# Patient Record
Sex: Female | Born: 1955 | Hispanic: Yes | Marital: Married | Smoking: Never smoker
Health system: Southern US, Community
[De-identification: ages and names within clinical notes are randomized; demographics above are authoritative.]

## PROBLEM LIST (undated history)

## (undated) DIAGNOSIS — F32A Depression, unspecified: Secondary | ICD-10-CM

## (undated) DIAGNOSIS — E785 Hyperlipidemia, unspecified: Secondary | ICD-10-CM

## (undated) DIAGNOSIS — F329 Major depressive disorder, single episode, unspecified: Secondary | ICD-10-CM

## (undated) HISTORY — PX: AUGMENTATION MAMMAPLASTY: SUR837

## (undated) HISTORY — PX: TUBAL LIGATION: SHX77

## (undated) HISTORY — PX: BREAST SURGERY: SHX581

## (undated) HISTORY — DX: Hyperlipidemia, unspecified: E78.5

---

## 2010-07-31 HISTORY — PX: ABDOMINAL HYSTERECTOMY: SHX81

## 2014-10-06 ENCOUNTER — Encounter: Payer: Self-pay | Admitting: Gynecology

## 2014-10-06 ENCOUNTER — Other Ambulatory Visit (HOSPITAL_COMMUNITY)
Admission: RE | Admit: 2014-10-06 | Discharge: 2014-10-06 | Disposition: A | Discharge status: Home / Self Care | Payer: BLUE CROSS/BLUE SHIELD | Source: Ambulatory Visit | Attending: Gynecology | Admitting: Gynecology

## 2014-10-06 ENCOUNTER — Ambulatory Visit (INDEPENDENT_AMBULATORY_CARE_PROVIDER_SITE_OTHER): Payer: BLUE CROSS/BLUE SHIELD | Admitting: Gynecology

## 2014-10-06 VITALS — BP 122/78 | Ht 63.0 in | Wt 145.0 lb

## 2014-10-06 DIAGNOSIS — M25559 Pain in unspecified hip: Secondary | ICD-10-CM | POA: Diagnosis not present

## 2014-10-06 DIAGNOSIS — N951 Menopausal and female climacteric states: Secondary | ICD-10-CM | POA: Diagnosis not present

## 2014-10-06 DIAGNOSIS — Z79890 Hormone replacement therapy (postmenopausal): Secondary | ICD-10-CM

## 2014-10-06 DIAGNOSIS — Z1159 Encounter for screening for other viral diseases: Secondary | ICD-10-CM

## 2014-10-06 DIAGNOSIS — Z1151 Encounter for screening for human papillomavirus (HPV): Secondary | ICD-10-CM | POA: Diagnosis present

## 2014-10-06 DIAGNOSIS — Z780 Asymptomatic menopausal state: Secondary | ICD-10-CM | POA: Diagnosis not present

## 2014-10-06 DIAGNOSIS — Z01419 Encounter for gynecological examination (general) (routine) without abnormal findings: Secondary | ICD-10-CM

## 2014-10-06 MED ORDER — ESTROGENS CONJUGATED 0.625 MG PO TABS: 0.6250 mg | ORAL_TABLET | Freq: Every day | ORAL | Status: DC

## 2014-10-06 NOTE — Patient Instructions (Signed)
Artritis reumatoidea (Rheumatoid Arthritis) La artritis reumatoidea es una enfermedad a largo plazo (crnica) inflamatoria que causa dolor, hinchazn y entumecimiento de las articulaciones. Puede afectar a todo el organismo, incluidos los ojos y los pulmones. Los efectos varan en gran medida entre los que sufren esta enfermedad. CAUSAS  La causa de esta enfermedad no se conoce. Suele ser hereditaria y es ms frecuente entre mujeres. Ciertas clulas del sistema de defensa natural del organismo (sistema inmunolgico) no funcionan adecuadamente y comienzan a Clinical biochemist. En principio involucra al tejido conjuntivo que alinea las articulaciones (membrana sinovial). Esto puede causar una lesin en la articulacin. SNTOMAS   Dolor, entumecimiento, hinchazn y disminucin de la movilidad en varias articulaciones, especialmente en las manos y los pies.  El entumecimiento empeora por la maana. Puede durar entre 1 y 2 horas, o ms.  Adormecimiento u hormigueo en las manos.  Fatiga.  Prdida del apetito.  Prdida de peso.  Fiebre no muy elevada.  Ojos y boca secos.  Bultos firmes (ndulos reumatoideos) que crecen por debajo de la piel en zonas como codos y manos. DIAGNSTICO  El diagnstico se realiza segn los sntomas descriptos, un examen y [place] de [place]. Algunas veces es necesario tomar radiografas. TRATAMIENTO  Los [employer] del tratamiento son Best boy, reducir la inflamacin y disminuir o Ambulance person lesin en las articulaciones y la discapacidad. Los mtodos varan y pueden ser:  Theatre manager un equilibrio entre reposo, [place] fsica y nutricin [place].  Medicamentos:  Analgsicos.  Corticoides y antiinflamatorios no esteroides para reducir la inflamacin.  Antirreumticos modificadores de la enfermedad (ARME) para disminuir el avance de la artritis reumatoidea.  Modificadores de respuesta biolgica para reducir la inflamacin y las  lesiones.  Fisioterapia y terapia ocupacional.  Ciruga en aquellos pacientes con lesiones graves en las articulaciones. Puede ser necesario el reemplazo o fusin de las articulaciones.  Los controles de [place] y el cuidado continuo, como visitas al [place], anlisis de [place] y [place], y Manufacturing engineer. INSTRUCCIONES PARA EL CUIDADO EN EL HOGAR   Mantngase fsicamente activo y Psychologist, prison and probation services la actividad cuando la enfermedad [place].  Consuma una dieta bien balanceada.  Aplquese calor en las articulaciones afectadas al despertarse y antes de [employer]. Mantenga el calor sobre la articulacin afectada durante el tiempo que le indique su mdico.  Aplquese hielo en las articulaciones afectadas luego de realizar actividades o de Geneticist, molecular.  Ponga el hielo en una bolsa plstica.  Coloque una toalla entre la piel y la bolsa de hielo.  Deje el hielo durante 15 a 68minutos, 3 a 4veces por da, o [place] los medicamentos y los suplementos solamente como se lo haya indicado el mdico.  Use las frulas segn las indicaciones de su mdico. Las frulas mantienen la funcin y la posicin de las articulaciones.  No duerma con almohadas debajo de las rodillas. Esto puede causarle espasmos.  Participe en un programa de [place] para mantenerse actualizado con los ltimos tratamientos y modos de Engineer, civil (consulting) enfermedad. SOLICITE ATENCIN MDICA DE INMEDIATO SI:  Sufre un desmayo.  Por momentos, siente debilidad extrema.  Desarrolla rpidamente calor y dolor en una articulacin que son ms intensos que los dolores articulares habituales.  Tiene escalofros.  Tiene fiebre. PARA OBTENER MS [place] Lexicographer of Rheumatology): www.rheumatology.org.  Fundacin contra la Artritis ([place]): www.arthritis.org Document Released: 04/26/2005 Document Revised:  12/01/2013 [facility] Patient Information 2015 [place], [place]. This information is  not intended to replace advice given to you by your health care provider. Make sure you discuss any questions you have with your health care provider.

## 2014-10-06 NOTE — Progress Notes (Signed)
Robin Boyd 08/15/1955 [id number]   History:    59 y.o.  for annual gyn exam who is a new patient to the practice. Patient recently moved here from [place]. She stated her gynecological care was there. She states she has never had any abnormal Pap smear in the past. She was complaining of hot flashes, irritability mood swing and vaginal dryness and anxiety as well as insomnia. She had been on estrogen 0.625 mg which she ran out a few months ago which has contributed to her symptoms. She stated that at the age of 59 she had an abdominal hysterectomy with ovarian conservation along with bladder suspension whereby a mesh was placed for fibroid uterus since stress urinary incontinence. Patient was also complaining of hip pains when she is inactive but improves with mobility. She denies any family history of any rheumatoid arthritis. She has not had her colonoscopy as of yet. Her mammogram was in 2015. She stated in 2015 she had a normal bone density study in been as well.  Past medical history,surgical history, family history and social history were all reviewed and documented in the EPIC chart.  Gynecologic History Patient's last menstrual period was 10/06/2010. Contraception: post menopausal status/total abdominal hysterectomy Last Pap: 2015. Results were: [place] we do not have report Last mammogram: 2015. Results were: Patient reports it was normal as well  Obstetric History OB History  Gravida Para Term Preterm AB SAB TAB Ectopic Multiple Living  2 2        2     # Outcome Date GA Lbr Len/2nd Weight Sex Delivery Anes PTL Lv  2 Para           1 Para                ROS: A ROS was performed and pertinent positives and negatives are included in the history.  GENERAL: No fevers or chills. HEENT: No change in vision, no earache, sore throat or sinus congestion. NECK: No pain or stiffness. CARDIOVASCULAR: No chest pain or pressure. No palpitations. PULMONARY: No shortness of breath,  cough or wheeze. GASTROINTESTINAL: No abdominal pain, nausea, vomiting or diarrhea, melena or bright red blood per rectum. GENITOURINARY: No urinary frequency, urgency, hesitancy or dysuria. MUSCULOSKELETAL: No joint or muscle pain, no back pain, no recent trauma. DERMATOLOGIC: No rash, no itching, no lesions. ENDOCRINE: No polyuria, polydipsia, no heat or cold intolerance. No recent change in weight. HEMATOLOGICAL: No anemia or easy bruising or bleeding. NEUROLOGIC: No headache, seizures, numbness, tingling or weakness. PSYCHIATRIC: No depression, no loss of interest in normal activity or change in sleep pattern.     Exam: chaperone present  BP 122/78 mmHg  Ht 5\' 3"  (1.6 m)  Wt 145 lb (65.772 kg)  BMI 25.69 kg/m2  LMP 10/06/2010  Body mass index is 25.69 kg/(m^2).  General appearance : Well developed well nourished female. No acute distress HEENT: Eyes: no retinal hemorrhage or exudates,  Neck supple, trachea midline, no carotid bruits, no thyroidmegaly Lungs: Clear to auscultation, no rhonchi or wheezes, or rib retractions  Heart: Regular rate and rhythm, no murmurs or gallops Breast:Examined in sitting and supine position were symmetrical in appearance, no palpable masses or tenderness,  no skin retraction, no nipple inversion, no nipple discharge, no skin discoloration, no axillary or supraclavicular lymphadenopathy Abdomen: no palpable masses or tenderness, no rebound or guarding Extremities: no edema or skin discoloration or tenderness  Pelvic:  Bartholin, Urethra, Skene Glands: Within normal limits  Vagina: No gross lesions or discharge  Cervix: Absent  Uterus  absent  Adnexa  Without masses or tenderness  Anus and perineum  normal   Rectovaginal  normal sphincter tone without palpated masses or tenderness             Hemoccult will schedule colonoscopy     Assessment/Plan:  59 y.o. female for annual exam with signs and symptoms consistent with the menopause.  Patient had ran out of her hormones that had been prescribed in [place]. She will be prescribed Premarin 0.625 mg to take 1 by mouth daily. We discussed the women's health initiative study. We discussed the risk benefits and pros and cons of hormone replacement therapy. She will be reminded to schedule her mammogram. We discussed importance of monthly self breast examination. She was given the name of I GI colleagues for her to schedule a screening colonoscopy. A cousin of her hip pains were going to check a rheumatoid factor blood test. The following screening tests were also ordered: Comprehensive metabolic panel, TSH, fasting lipid profile, CBC, urinalysis. A Pap smear was done today for our records and then we will adhere to the new guidelines.  New CDC guidelines is recommending patients be tested once in her lifetime for hepatitis C antibody who were born between [birth year] through 1963. This was discussed with the patient today and has agreed to be tested today.   Terrance Mass MD, 11:57 AM 10/06/2014

## 2014-10-07 LAB — URINALYSIS W MICROSCOPIC + REFLEX CULTURE
BACTERIA UA: NONE SEEN
BILIRUBIN URINE: NEGATIVE
CASTS: NONE SEEN
CRYSTALS: NONE SEEN
Glucose, UA: NEGATIVE mg/dL
Ketones, ur: NEGATIVE mg/dL
Leukocytes, UA: NEGATIVE
NITRITE: NEGATIVE
PH: 6 (ref 5.0–8.0)
Protein, ur: NEGATIVE mg/dL
Specific Gravity, Urine: 1.016 (ref 1.005–1.030)
Urobilinogen, UA: 0.2 mg/dL (ref 0.0–1.0)

## 2014-10-08 ENCOUNTER — Other Ambulatory Visit: Payer: BLUE CROSS/BLUE SHIELD

## 2014-10-08 LAB — CYTOLOGY - PAP

## 2014-10-08 LAB — CBC WITH DIFFERENTIAL/PLATELET
BASOS ABS: 0 10*3/uL (ref 0.0–0.1)
Basophils Relative: 1 % (ref 0–1)
EOS ABS: 0.1 10*3/uL (ref 0.0–0.7)
Eosinophils Relative: 2 % (ref 0–5)
HEMATOCRIT: 39.1 % (ref 36.0–46.0)
Hemoglobin: 12.5 g/dL (ref 12.0–15.0)
LYMPHS ABS: 1.8 10*3/uL (ref 0.7–4.0)
LYMPHS PCT: 36 % (ref 12–46)
MCH: 28.2 pg (ref 26.0–34.0)
MCHC: 32 g/dL (ref 30.0–36.0)
MCV: 88.1 fL (ref 78.0–100.0)
MPV: 9.8 fL (ref 8.6–12.4)
Monocytes Absolute: 0.4 10*3/uL (ref 0.1–1.0)
Monocytes Relative: 9 % (ref 3–12)
NEUTROS ABS: 2.5 10*3/uL (ref 1.7–7.7)
NEUTROS PCT: 52 % (ref 43–77)
PLATELETS: 242 10*3/uL (ref 150–400)
RBC: 4.44 MIL/uL (ref 3.87–5.11)
RDW: 14.7 % (ref 11.5–15.5)
WBC: 4.9 10*3/uL (ref 4.0–10.5)

## 2014-10-08 LAB — LIPID PANEL
Cholesterol: 283 mg/dL — ABNORMAL HIGH (ref 0–200)
HDL: 56 mg/dL (ref >=46)
LDL CALC: 206 mg/dL — AB (ref 0–99)
TRIGLYCERIDES: 107 mg/dL (ref <150)
Total CHOL/HDL Ratio: 5.1 Ratio
VLDL: 21 mg/dL (ref 0–40)

## 2014-10-08 LAB — COMPREHENSIVE METABOLIC PANEL
ALBUMIN: 4.2 g/dL (ref 3.5–5.2)
ALT: 53 U/L — ABNORMAL HIGH (ref 0–35)
AST: 39 U/L — AB (ref 0–37)
Alkaline Phosphatase: 36 U/L — ABNORMAL LOW (ref 39–117)
BUN: 16 mg/dL (ref 6–23)
CO2: 27 meq/L (ref 19–32)
Calcium: 9 mg/dL (ref 8.4–10.5)
Chloride: 106 mEq/L (ref 96–112)
Creat: 0.86 mg/dL (ref 0.50–1.10)
GLUCOSE: 78 mg/dL (ref 70–99)
Potassium: 4.3 mEq/L (ref 3.5–5.3)
Sodium: 143 mEq/L (ref 135–145)
TOTAL PROTEIN: 6.6 g/dL (ref 6.0–8.3)
Total Bilirubin: 0.5 mg/dL (ref 0.2–1.2)

## 2014-10-08 LAB — RHEUMATOID FACTOR: Rhuematoid fact SerPl-aCnc: <10 IU/mL (ref <=14)

## 2014-10-08 LAB — TSH: TSH: 1.499 u[IU]/mL (ref 0.350–4.500)

## 2014-10-09 LAB — HEPATITIS C ANTIBODY: HCV Ab: NEGATIVE

## 2015-10-07 ENCOUNTER — Encounter: Payer: BLUE CROSS/BLUE SHIELD | Admitting: Gynecology

## 2015-10-21 ENCOUNTER — Other Ambulatory Visit: Payer: Self-pay | Admitting: Gynecology

## 2015-10-21 NOTE — Telephone Encounter (Signed)
Annual scheduled on 11/23/15

## 2015-11-23 ENCOUNTER — Encounter: Payer: BLUE CROSS/BLUE SHIELD | Admitting: Gynecology

## 2015-11-23 ENCOUNTER — Ambulatory Visit (INDEPENDENT_AMBULATORY_CARE_PROVIDER_SITE_OTHER): Payer: BLUE CROSS/BLUE SHIELD | Admitting: Gynecology

## 2015-11-23 ENCOUNTER — Encounter: Payer: Self-pay | Admitting: Gynecology

## 2015-11-23 VITALS — BP 118/74 | Ht 62.5 in | Wt 146.0 lb

## 2015-11-23 DIAGNOSIS — Z01419 Encounter for gynecological examination (general) (routine) without abnormal findings: Secondary | ICD-10-CM | POA: Diagnosis not present

## 2015-11-23 DIAGNOSIS — Z79890 Hormone replacement therapy (postmenopausal): Secondary | ICD-10-CM | POA: Diagnosis not present

## 2015-11-23 DIAGNOSIS — Z780 Asymptomatic menopausal state: Secondary | ICD-10-CM | POA: Diagnosis not present

## 2015-11-23 MED ORDER — ESTRADIOL 0.1 MG/24HR TD PTTW: 1.0000 | MEDICATED_PATCH | TRANSDERMAL | Status: DC

## 2015-11-23 MED ORDER — ESTROGENS CONJUGATED 0.625 MG PO TABS: 0.6250 mg | ORAL_TABLET | Freq: Every day | ORAL | Status: DC

## 2015-11-23 NOTE — Patient Instructions (Addendum)
Estradiol skin patches Qu es este medicamento? Los parches de ESTRADIOL contienen un estrgeno. Se utiliza como terapia de reemplazo hormonal en mujeres que estn experimentando la menopausia. Ayuda a tratar los sofocos y a prevenir la osteoporosis. Se utiliza tambin para tratar a mujeres con bajos niveles de estrgeno o aquellas que le han extirpado sus ovarios. Este medicamento puede ser utilizado para otros usos; si tiene alguna pregunta consulte con su proveedor de atencin mdica o con su farmacutico. Qu le debo informar a mi profesional de la salud antes de tomar este medicamento? Necesita saber si usted presenta alguno de los siguientes problemas o situaciones: -sangrado vaginal anormal -enfermedad vascular o cogulos sanguneos -cncer de mama, cervical, endometrio, ovario, hgado o uterino -demencia -diabetes -enfermedad de la vescula biliar -enfermedad cardiaca o ataque cardiaco reciente -alta presin sangunea -niveles elevados de colesterol -nivel elevado de calcio en la sangre -histerectoma -enfermedad renal -enfermedad heptica -migraas -deficiencia de protena C -deficiencia de protena S -derrame cerebral -lupus eritematoso sistmico (LES) -fuma tabaco -reaccin alrgica o inusual a los estrgenos, otras hormonas, otros medicamentos, alimentos, colorantes o conservantes -si est embarazada o buscando quedar embarazada -si est amamantando a un beb Cmo debo [employer]? Este medicamento es slo para uso externo. Siga las instrucciones de la etiqueta del [place]. Abra la bolsa sin usar tijeras. Quite la cubierta rgida que protege al [place]. Trate de no tocar [employer]. Aplique el parche con el lado con pegamento hacia la piel, en una zona del cuerpo que est limpia, seca y sin vello. Evite las zonas con lesiones, irritaciones, callosidades o cicatrices. No aplique los parches en las mamas ni alrededor de Science writer. Utilice un lugar  distinto en cada ocasin para evitar la irritacin de la piel. No corte ni recorte el parche. No interrumpa su uso excepto si as lo indica su mdico o su profesional de [employer]	[place]. No use ms de un parche por vez a menos que su mdico o su profesional de la salud indique lo contrario. Hable con su pediatra para informarse acerca del uso de este medicamento en nios. Puede requerir atencin especial. Usted recibir un prospecto para el paciente para este producto con cada receta y relleno. Asegrese de leer este folleto cada vez cuidadosamente. Este folleto puede cambiar con frecuencia. Sobredosis: Pngase en contacto inmediatamente con un centro toxicolgico o una sala de urgencia si usted cree que haya tomado demasiado medicamento. ATENCIN: [employer] es solo para usted. No comparta este medicamento con nadie. Qu sucede si me olvido de una dosis? Si olvida una dosis, aplquela lo antes posible. Si es casi la hora de la prxima dosis, aplique slo esa dosis. No use dosis adicionales o dobles. Qu puede interactuar con este medicamento? No tome esta medicina con ninguno de los siguientes medicamentos: -inhibidores de Hydrologist, tales como aminoglutetimida, anastrozol, exemestno, letrozol, testolactona Esta medicina tambin puede interactuar con los siguientes medicamentos: -carbamazepina -ciertos antibiticos utilizados para tratar infecciones -ciertos barbitricos usados para inducir el sueo o tratar las convulsiones -jugo de toronja -medicamentos para infecciones micticas, tales como itraconazol y Arboriculturist -raloxifeno o tamoxifeno -rifabutina, rifampicina o rifapentina -ritonavir -hierba de [place] Puede ser que esta lista no menciona todas las posibles interacciones. Informe a su profesional de [employer]	[place] de [employer] productos a base de hierbas, medicamentos de [place] o suplementos nutritivos que est tomando. Si usted fuma, consume bebidas alcohlicas o si utiliza  drogas ilegales, indqueselo tambin a su profesional de [employer]	[place]. Algunas sustancias pueden  interactuar con su medicamento. A qu debo estar atento al usar [employer]? Visite a su mdico o a su profesional de la salud para chequear su evolucin peridicamente. Mientras permanezca en terapia con este medicamento, deber hacerse exmenes de Papanicolaou y exmenes de las mamas y la pelvis en forma regular. Tambin debe discutir con su profesional de la salud la necesidad de Sport and exercise psychologist peridicamente y seguir las pautas que este profesional establezca para estas pruebas. Este medicamento puede hacer que su cuerpo retenga lquido, lo que puede provocar que se le hinchen los dedos, manos o tobillos. Su presin sangunea Regulatory affairs officer. Comunquese con su mdico o con su profesional de la salud si siente que est reteniendo lquido. Si tiene algn motivo para pensar que est embarazada, deje de usar este medicamento de inmediato y comunquese con su mdico o con su profesional de Technical sales engineer. El hbito de fumar aumenta el riesgo de formacin de cogulos o de sufrir un derrame cerebral mientras este usando este medicamento, especialmente si tiene ms de 35 aos. Se le recomienda enfticamente que no fume. Si [place] lentes de contacto y observa cambios en la visin, o si los lentes comienzan a resultarle incmodos, consulte al especialista en ojos o su profesional de [employer]	[place]. Este medicamento incrementa el riesgo de desarrollar una enfermedad (hiperplasia del endometrio) que puede derivar en cncer del revestimiento del tero en mujeres que an tienen su tero. Al tomar otra hormona llamada progestina junto con este medicamento, se disminuye el riesgo de desarrollar esta enfermedad. Por lo tanto, si no le han extirpado el tero (por medio de una histerectoma), su mdico puede recetarle progestina para acompaar el tratamiento con estrgeno. Sin embargo, debe saber que puede correr [employer] adicionales  para la salud al tomar estrgenos con progestina. Debe consultar sobre el uso de estrgenos y progestinas a su profesional de la salud a fin de Aeronautical engineer beneficios y Gaffer que puede [place]. Si va a someterse a una operacin o un procedimiento MRI, tal vez deba dejar de tomar este medicamento. Consulte a su profesional de la salud antes de Editor, commissioning operacin. Puede baarse y Optometrist otras actividades mientras [place] el parche. Si el parche se afloja o se desprende, puede volver a aplicarlo siempre que quede suficientemente adherido a la piel. Al volver a aplicar el parche, debe hacerlo en una zona diferente. De lo contrario, reemplcelo por un parche nuevo. Qu efectos secundarios puedo tener al [employer] este medicamento? Efectos secundarios que debe informar a su mdico o a Barrister's clerk de la salud tan pronto como sea posible: -Chief of Staff como erupcin cutnea, picazn o urticarias, hinchazn de la cara, labios o lengua -secreciones o cambios en el tejido de las mamas -cambios en la visin -dolor en el pecho -confusin, dificultad para hablar o entender -orina de color amarillo oscuro -sensacin general de estar enfermo o sntomas gripales -heces claras -nuseas o vmito -dolor, hinchazn, sensacin clida en las piernas -dolor en la parte superior del abdomen -dolores de cabeza severos -falta de aliento repentina -debilidad o entumecimiento repentino de la cara, brazos o piernas -problemas para caminar, mareos, prdida de la coordinacin o equilibrio -sangrado vaginal inusual -color amarillento de los ojos o la piel Efectos secundarios que, por lo general, no requieren Geophysical data processor (debe informarlos a su mdico o a Barrister's clerk de la salud si persisten o si son molestos): -cada del cabello -aumento de apetito o sed -aumento de descargas de orina -sntomas de una infeccin vaginal, como picazn, irritacin  o flujo inusual -cansancio o debilidad inusual Puede  ser que esta lista no menciona todos los posibles efectos secundarios. Comunquese a su mdico por asesoramiento mdico [employer]. Usted puede informar los efectos secundarios a la FDA por telfono al 1-800-FDA-1088. Dnde debo guardar mi medicina? Mantngala fuera del alcance de los nios. Gurdela a temperatura ambiente a menos de 30 grados C (86 grados F). No guarde parches que hayan estado fuera de su bolsa protectora. Deseche todo el medicamento que no haya utilizado, despus de la fecha de vencimiento. Deseche los parches utilizados de [employer]. Ya que los parches usados todava pueden contener hormonas activas, doble el parche por la mitad con los lados con pegamento hacia adentro antes de desecharlo. ATENCIN: Este folleto es un resumen. Puede ser que no cubra toda la posible informacin. Si usted tiene preguntas acerca de esta medicina, consulte con su mdico, su farmacutico o su profesional de Technical sales engineer.    2016, Elsevier/Gold Standard. (2014-09-08 00:00:00) Bone Densitometry Bone densitometry is an imaging test that uses a special X-ray to measure the amount of calcium and other minerals in your bones (bone density). This test is also known as a bone mineral density test or dual-energy X-ray absorptiometry (DXA). The test can measure bone density at your hip and your spine. It is similar to having a regular X-ray. You may have this test to:  Diagnose a condition that causes weak or thin bones (osteoporosis).  Predict your risk of a broken bone (fracture).  Determine how well osteoporosis treatment is working. LET [facility] CARE PROVIDER KNOW ABOUT:  Any allergies you have.  All medicines you are taking, including vitamins, herbs, eye drops, creams, and over-the-counter medicines.  Previous problems you or members of your family have had with the use of anesthetics.  Any blood disorders you have.  Previous surgeries you have had.  Medical  conditions you have.  Possibility of pregnancy.  Any other medical test you had within the previous 14 days that used contrast material. RISKS AND COMPLICATIONS Generally, this is a safe procedure. However, problems can occur and may include the following:  This test exposes you to a very small amount of radiation.  The risks of radiation exposure may be greater to unborn children. BEFORE THE PROCEDURE  Do not take any calcium supplements for 24 hours before having the test. You can otherwise eat and drink what you usually do.  Take off all metal jewelry, eyeglasses, dental appliances, and any other metal objects. PROCEDURE  You may lie on an exam table. There will be an X-ray generator below you and an imaging device above you.  Other devices, such as boxes or braces, may be used to position your body properly for the scan.  You will need to lie still while the machine slowly scans your body.  The images will show up on a computer monitor. AFTER THE PROCEDURE You may need more testing at a later time.   This information is not intended to replace advice given to you by your health care provider. Make sure you discuss any questions you have with your health care provider.   Document Released: 08/08/2004 Document Revised: 08/07/2014 Document Reviewed: 12/25/2013 Elsevier Interactive Patient Education [employer].

## 2015-11-23 NOTE — Progress Notes (Signed)
Robin Boyd 08/25/1955 [id number]   History:    60 y.o.  for annual gyn exam with no complaints today. Patient was seen as a new patient for the first time last year. Patient had moved to [place] from [place]. Patient reports no past history of any abnormal Pap smears prior to her abdominal hysterectomy with ovarian conservation and bladder suspension with mesh as a result of systematic fibroid uterus and urinary incontinence. Patient had been on Premarin 00.625 milligrams daily and would like to switch over to the patch. She has not had a colonoscopy as of yet and her bone density study was done over 3 years ago in [place]. Patient has silicon breast implants. Past medical history,surgical history, family history and social history were all reviewed and documented in the EPIC chart.  Gynecologic History Patient's last menstrual period was 10/06/2010. Contraception: status post hysterectomy Last Pap: normal  . Results were: normal 2016 Last mammogram: several years ago and then as well. Results were: normal  Obstetric History OB History  Gravida Para Term Preterm AB SAB TAB Ectopic Multiple Living  2 2        2     # Outcome Date GA Lbr Len/2nd Weight Sex Delivery Anes PTL Lv  2 Para           1 Para                ROS: A ROS was performed and pertinent positives and negatives are included in the history.  GENERAL: No fevers or chills. HEENT: No change in vision, no earache, sore throat or sinus congestion. NECK: No pain or stiffness. CARDIOVASCULAR: No chest pain or pressure. No palpitations. PULMONARY: No shortness of breath, cough or wheeze. GASTROINTESTINAL: No abdominal pain, nausea, vomiting or diarrhea, melena or bright red blood per rectum. GENITOURINARY: No urinary frequency, urgency, hesitancy or dysuria. MUSCULOSKELETAL: No joint or muscle pain, no back pain, no recent trauma. DERMATOLOGIC: No rash, no itching, no lesions. ENDOCRINE: No polyuria, polydipsia, no heat or  cold intolerance. No recent change in weight. HEMATOLOGICAL: No anemia or easy bruising or bleeding. NEUROLOGIC: No headache, seizures, numbness, tingling or weakness. PSYCHIATRIC: No depression, no loss of interest in normal activity or change in sleep pattern.     Exam: chaperone present  BP 118/74 mmHg  Ht 5' 2.5" (1.588 m)  Wt 146 lb (66.225 kg)  BMI 26.26 kg/m2  LMP 10/06/2010  Body mass index is 26.26 kg/(m^2).  General appearance : Well developed well nourished female. No acute distress HEENT: Eyes: no retinal hemorrhage or exudates,  Neck supple, trachea midline, no carotid bruits, no thyroidmegaly Lungs: Clear to auscultation, no rhonchi or wheezes, or rib retractions  Heart: Regular rate and rhythm, no murmurs or gallops Breast:Examined in sitting and supine position were symmetrical in appearance, no palpable masses or tenderness,  no skin retraction, no nipple inversion, no nipple discharge, no skin discoloration, no axillary or supraclavicular lymphadenopathy Abdomen: no palpable masses or tenderness, no rebound or guarding Extremities: no edema or skin discoloration or tenderness  Pelvic:  Bartholin, Urethra, Skene Glands: Within normal limits             Vagina: No gross lesions or discharge  Cervix:  Absent  Uterus  Absent  Adnexa  Without masses or tenderness  Anus and perineum  normal   Rectovaginal  normal sphincter tone without palpated masses or tenderness             Hemoccult  to  schedule colonoscopy     Assessment/Plan:  60 y.o. female for annual exam  who was given a requisition to schedule her overdue mammogram. Also she was given the name of our GI colleagues for her to schedule her screening colonoscopy which she has not had. Also she will schedule in the next few weeks here in our office a follow-up bone density study since her last was done several years ago in [place] we do not have the report. Also at the same time she will have the following  screening blood work: Fasting lipid profile, comprehensive metabolic panel, TSH, CBC, and urinalysis. Pap smear not indicated according to the new guidelines. We discussed importance of calcium vitamin D and regular exercise for osteoporosis prevention. Patient will be switched to Vivelle Dot 0.1 mg to apply twice a week transdermally. Risk benefits and pros and cons of hormone replacement therapy were discussed as well as literature information was provided.   Terrance Mass MD, 3:48 PM 11/23/2015

## 2015-11-30 ENCOUNTER — Other Ambulatory Visit: Payer: Self-pay | Admitting: Orthopedic Surgery

## 2015-12-16 ENCOUNTER — Other Ambulatory Visit: Payer: BLUE CROSS/BLUE SHIELD

## 2015-12-16 ENCOUNTER — Other Ambulatory Visit: Payer: Self-pay | Admitting: Gynecology

## 2015-12-16 ENCOUNTER — Ambulatory Visit (INDEPENDENT_AMBULATORY_CARE_PROVIDER_SITE_OTHER): Payer: BLUE CROSS/BLUE SHIELD

## 2015-12-16 DIAGNOSIS — Z13820 Encounter for screening for osteoporosis: Secondary | ICD-10-CM

## 2015-12-16 DIAGNOSIS — Z780 Asymptomatic menopausal state: Secondary | ICD-10-CM | POA: Diagnosis not present

## 2015-12-16 DIAGNOSIS — Z79890 Hormone replacement therapy (postmenopausal): Secondary | ICD-10-CM | POA: Diagnosis not present

## 2015-12-16 DIAGNOSIS — Z01419 Encounter for gynecological examination (general) (routine) without abnormal findings: Secondary | ICD-10-CM

## 2015-12-22 ENCOUNTER — Other Ambulatory Visit: Payer: Self-pay | Admitting: Anesthesiology

## 2015-12-22 ENCOUNTER — Other Ambulatory Visit: Payer: BLUE CROSS/BLUE SHIELD

## 2015-12-22 DIAGNOSIS — Z01419 Encounter for gynecological examination (general) (routine) without abnormal findings: Secondary | ICD-10-CM

## 2015-12-22 LAB — CBC WITH DIFFERENTIAL/PLATELET
Basophils Absolute: 48 cells/uL (ref 0–200)
Basophils Relative: 1 %
EOS PCT: 1 %
Eosinophils Absolute: 48 cells/uL (ref 15–500)
HCT: 40.6 % (ref 35.0–45.0)
HEMOGLOBIN: 13.2 g/dL (ref 11.7–15.5)
LYMPHS ABS: 2208 {cells}/uL (ref 850–3900)
Lymphocytes Relative: 46 %
MCH: 27.9 pg (ref 27.0–33.0)
MCHC: 32.5 g/dL (ref 32.0–36.0)
MCV: 85.8 fL (ref 80.0–100.0)
MONOS PCT: 10 %
MPV: 9.9 fL (ref 7.5–12.5)
Monocytes Absolute: 480 cells/uL (ref 200–950)
NEUTROS ABS: 2016 {cells}/uL (ref 1500–7800)
Neutrophils Relative %: 42 %
Platelets: 254 10*3/uL (ref 140–400)
RBC: 4.73 MIL/uL (ref 3.80–5.10)
RDW: 14.5 % (ref 11.0–15.0)
WBC: 4.8 10*3/uL (ref 3.8–10.8)

## 2015-12-23 ENCOUNTER — Encounter: Payer: Self-pay | Admitting: Gynecology

## 2015-12-23 LAB — LIPID PANEL
CHOL/HDL RATIO: 4.9 ratio (ref <=5.0)
Cholesterol: 285 mg/dL — ABNORMAL HIGH (ref 125–200)
HDL: 58 mg/dL (ref >=46)
LDL Cholesterol: 204 mg/dL — ABNORMAL HIGH (ref <130)
Triglycerides: 114 mg/dL (ref <150)
VLDL: 23 mg/dL (ref <30)

## 2015-12-23 LAB — COMPLETE METABOLIC PANEL WITH GFR
ALBUMIN: 4 g/dL (ref 3.6–5.1)
ALK PHOS: 32 U/L — AB (ref 33–130)
ALT: 19 U/L (ref 6–29)
AST: 18 U/L (ref 10–35)
BUN: 12 mg/dL (ref 7–25)
CALCIUM: 9 mg/dL (ref 8.6–10.4)
CHLORIDE: 107 mmol/L (ref 98–110)
CO2: 22 mmol/L (ref 20–31)
CREATININE: 0.74 mg/dL (ref 0.50–1.05)
GFR, Est African American: >89 mL/min (ref >=60)
GFR, Est Non African American: >89 mL/min (ref >=60)
GLUCOSE: 85 mg/dL (ref 65–99)
POTASSIUM: 3.9 mmol/L (ref 3.5–5.3)
SODIUM: 140 mmol/L (ref 135–146)
Total Bilirubin: 0.4 mg/dL (ref 0.2–1.2)
Total Protein: 6.7 g/dL (ref 6.1–8.1)

## 2015-12-23 LAB — TSH: TSH: 1.3 m[IU]/L

## 2016-01-04 ENCOUNTER — Encounter: Payer: Self-pay | Admitting: Gynecology

## 2016-01-04 ENCOUNTER — Ambulatory Visit (INDEPENDENT_AMBULATORY_CARE_PROVIDER_SITE_OTHER): Payer: BLUE CROSS/BLUE SHIELD | Admitting: Gynecology

## 2016-01-04 VITALS — BP 126/78

## 2016-01-04 DIAGNOSIS — E785 Hyperlipidemia, unspecified: Secondary | ICD-10-CM | POA: Diagnosis not present

## 2016-01-04 DIAGNOSIS — R232 Flushing: Secondary | ICD-10-CM

## 2016-01-04 DIAGNOSIS — Z79890 Hormone replacement therapy (postmenopausal): Secondary | ICD-10-CM | POA: Diagnosis not present

## 2016-01-04 MED ORDER — PRAVASTATIN SODIUM 10 MG PO TABS: 10.0000 mg | ORAL_TABLET | Freq: Every day | ORAL | Status: DC

## 2016-01-04 MED ORDER — ESTROGENS CONJUGATED 0.625 MG PO TABS
0.6250 mg | ORAL_TABLET | Freq: Every day | ORAL | Status: DC
Start: 2016-01-04 — End: 2016-11-17

## 2016-01-04 NOTE — Patient Instructions (Signed)
Conjugated Estrogens tablets Qu es este medicamento? Los ESTROGENOS CONJUGADOS son un estrgeno. Se utiliza como terapia de reemplazo hormonal en mujeres que estn experimentando la menopausia. Ayuda a tratar los sofocos y a prevenir la osteoporosis. Se utiliza tambin para tratar mujeres con bajos niveles de estrgeno o aquellas que le han extirpado sus ovarios. Este medicamento puede ser utilizado para otros usos; si tiene alguna pregunta consulte con su proveedor de atencin mdica o con su farmacutico. Qu le debo informar a mi profesional de la salud antes de tomar este medicamento? Necesita saber si usted presenta alguno de los siguientes problemas o situaciones: -sangrado vaginal anormal -enfermedad vascular o cogulos sanguneos -cncer de mama, cervical, endometrio, ovario, hgado o uterino -demencia -diabetes -endometriosis -fibroides -enfermedad de la vescula biliar -enfermedad cardiaca o ataque cardiaco reciente -alta presin sangunea -niveles elevados de colesterol -nivel elevado de calcio en la sangre -enfermedad renal -enfermedad heptica -depresin mental -migraas -deficiencia de protena C -deficiencia de protena S -derrame cerebral -fuma tabaco -una reaccin alrgica o inusual a los estrgenos, otros medicamentos, alimentos, colorantes o conservantes -si est embarazada o buscando quedar embarazada -si est amamantando a un beb Cmo debo utilizar este medicamento? Tome este medicamento por va oral con un vaso de agua. Siga las instrucciones de la etiqueta del [place]. Tome sus dosis a intervalos regulares a la [employer]. No tome su medicamento con una frecuencia mayor a la indicada. Usted recibir un prospecto para el paciente para este producto con cada receta y relleno. Asegrese de leer este folleto cada vez cuidadosamente. Este folleto puede cambiar con frecuencia. Hable con su pediatra para informarse acerca del uso de este  medicamento en nios. Este medicamento no est aprobado para uso en nios. Sobredosis: Pngase en contacto inmediatamente con un centro toxicolgico o una sala de urgencia si usted cree que haya tomado demasiado medicamento. ATENCIN: [employer] es solo para usted. No comparta este medicamento con nadie. Qu sucede si me olvido de una dosis? Si olvida una dosis, tmela lo antes posible. Si es casi la hora de la prxima dosis, tome slo esa dosis. No tome dosis adicionales o dobles. Qu puede interactuar con este medicamento? No tome esta medicina con ninguno de los siguientes medicamentos: -inhibidores de Hydrologist, tales como aminoglutetimida, anastrozol, exemestno, letrozol, testolactona -metirapona Esta medicina tambin puede interactuar con los siguientes medicamentos: -barbitricos, como fenobarbital -carbamazepina -claritromicina -eritromicina -jugo de toronja -medicamentos para infecciones micticas, tales como itraconazol y quetoconazol -fenitona -rifampicina -ritonavir -hierba de [place] tiroideas Puede ser que esta lista no menciona todas las posibles interacciones. Informe a su profesional de KB Home	[place] de [employer] productos a base de hierbas, medicamentos de [place] o suplementos nutritivos que est tomando. Si usted fuma, consume bebidas alcohlicas o si utiliza drogas ilegales, indqueselo tambin a su profesional de KB Home	[place]. Algunas sustancias pueden interactuar con su medicamento. A qu debo estar atento al usar Coca[employer]Cola? Visite a su profesional de la salud para Immunologist. Debe hacerse exmenes de Papanicolaou y exmenes de las mamas y la pelvis en forma regular mientras est tomando este medicamento. Tambin debe discutir con su profesional de la salud la necesidad de Sport and exercise psychologist peridicamente y seguir las pautas que este profesional establezca para estas pruebas. Este medicamento puede hacer que su  cuerpo retenga lquido, lo que puede provocar que se le hinchen los dedos, manos o tobillos. Su presin sangunea Regulatory affairs officer. Comunquese con su mdico o con su profesional de  la salud si siente que est reteniendo lquido. Si tiene algn motivo para pensar que est embarazada, deje de tomar este medicamento de inmediato y comunquese con su mdico o con su profesional de Technical sales engineer. El fumar tabaco mientras toma este medicamento aumenta el riesgo de formacin de cogulos o de sufrir un derrame cerebral, especialmente si tiene ms de 35 aos de edad. Se le recomienda enfticamente que no fume. Si [place] lentes de contacto y observa cambios en la visin, o si los lentes comienzan a resultarle incmodos, consulte con su mdico de los ojos. El recubrimiento de algunas marcas de este medicamento no se disuelve. El recubrimiento puede aparecer intacto en las heces. Esto no debe ser motivo de preocupacin. Si ve algo que se parece a una tableta [employer], consulte a su proveedor de Geophysical data processor. Este medicamento incrementa el riesgo de desarrollar una enfermedad (hiperplasia del endometrio) que puede derivar en cncer del revestimiento del tero. Al tomar otra hormona llamada progestina juntamente con este medicamento, se disminuye el riesgo de desarrollar esta enfermedad. Por lo tanto, si no le han extirpado el tero (por medio de una histerectoma), su mdico puede recetarle progestina para acompaar el tratamiento con estrgeno. Sin embargo, debe saber que puede correr [employer] adicionales para la salud al tomar estrgenos con progestina. Debe consultar sobre el uso de estrgenos y progestinas a su profesional de la salud a fin de Aeronautical engineer beneficios y Gaffer que puede [place]. Si va a someterse a una operacin, tal vez deba dejar de tomar este medicamento. Consulte con su profesional de la salud antes de Editor, commissioning operacin. Qu efectos secundarios puedo tener al [employer] este medicamento? Efectos  secundarios que debe informar a su mdico o a Barrister's clerk de la salud tan pronto como sea posible: -Chief of Staff como erupcin cutnea, picazn o urticarias, hinchazn de la cara, labios o lengua -secreciones o cambios en el tejido de las mamas -cambios en la visin -dolor en el pecho -confusin, dificultad para hablar o entender -orina de color amarillo oscuro -sensacin general de estar enfermo o sntomas gripales -heces claras -nuseas o vmito -dolor, hinchazn, sensacin clida en las piernas -dolor en la parte superior del abdomen -dolores de cabeza severos -falta de aliento repentina -debilidad o entumecimiento repentino de la cara, brazos o piernas -problemas para caminar, mareos, prdida de la coordinacin o equilibrio -sangrado vaginal inusual -color amarillento de los ojos o la piel Efectos secundarios que, por lo general, no requieren Geophysical data processor (debe informarlos a su mdico o a Barrister's clerk de la salud si persisten o si son molestos): -cada del cabello -aumento de apetito o sed -aumento de descargas de orina -sntomas de una infeccin vaginal, como picazn, irritacin o flujo inusual -cansancio o debilidad inusual Puede ser que esta lista no menciona todos los posibles efectos secundarios. Comunquese a su mdico por asesoramiento mdico [employer]. Usted puede informar los efectos secundarios a la FDA por telfono al 1-800-FDA-1088. Dnde debo guardar mi medicina? Mantngala fuera del alcance de los nios. Gurdela a [employer], entre 15 y 39 grados C (62 y 75 grados F). Deseche todo el medicamento que no haya utilizado, despus de la fecha de vencimiento. ATENCIN: Este folleto es un resumen. Puede ser que no cubra toda la posible informacin. Si usted tiene preguntas acerca de esta medicina, consulte con su mdico, su farmacutico o su profesional de Technical sales engineer.    2016, Elsevier/Gold Standard. (2014-09-08  00:00:00) Colesterol (Cholesterol) El colesterol es [place]  sustancia blanca y cerosa parecida a la grasa que el organismo necesita en pequeas cantidades. El hgado fabrica todo el colesterol que necesita. La sangre lo transporta desde el hgado a travs de los vasos sanguneos. Los depsitos de colesterol (placa) pueden acumularse en las paredes de los vasos sanguneos, lo que ocasiona el estrechamiento y la rigidez de las arterias. Las placas de colesterol aumentan el riesgo de ataque cardaco e ictus.  Aunque sea muy elevado, la concentracin de colesterol no puede percibirse. La nica forma de saber que tiene colesterol alto es mediante un anlisis de [place]. Una vez que se conocen las concentraciones de Research officer, trade union, se Mining engineer un registro de los [place] de [place]. Trabaje con el mdico para [employer] en el rango deseado.  [place]?  El colesterol total es una medida general de todo el colesterol en [place].  El LDL es el que se conoce como colesterol malo y es el que se deposita en las paredes de las arterias. Su concentracin debe ser baja.  El HDL es el colesterol bueno porque limpia las arterias y arrastra el LDL. Su concentracin debe ser alta.  Los triglicridos son grasas que el cuerpo puede quemar ya sea para energa o para almacenamiento. Las concentraciones altas estn estrechamente vinculadas con las enfermedades cardacas. CULES SON LAS CONCENTRACIONES DE COLESTEROL DESEADAS?  El colesterol total por debajo de 200.  El LDL por debajo de 100 en las personas en riesgo y por debajo de 74 en aquellas que corren un riesgo muy alto.  El HDL por [place] de 50es bueno y por [place] de 60 es lo mejor.  Los triglicridos por debajo de 150. [place]?  Dieta Siga los programas de alimentacin que el mdico le indique.  Elija el pescado o la carne blanca de pollo y [place], asados u horneados. Limite los cortes grasos  de carne roja, los alimentos fritos y las carnes procesadas, como las salchichas y los embutidos.  Coma gran cantidad de frutas y verduras frescas.  Elija los cereales integrales, los frijoles, las pastas, las papas y los cereales.  Use solamente pequeas cantidades de aceite de oliva, maz o canola.  No coma mantequilla, [place], margarina o aceites de [place].  Evite los alimentos que contengan grasas trans.  Tome leche semidescremada o sin grasa y coma yogur y quesos bajos en grasas o sin grasa. Evite la Mattel, la crema, los [place], las yemas de [place] y los quesos enteros.  Los postres sanos incluyen la torta ngel, los bocadillos de [place], las Administrator con forma de [place], los caramelos duros, los helados de agua y el yogur bajo en grasa o sin grasa. Evite las [place], tortas, pasteles y [place].  Haga actividad fsica. Siga los programas de ejercicios segn las indicaciones del mdico.  Un programa regular ayuda a bajar el colesterol LDL y aumentar el HDL,  y a Technical sales engineer.  Haga cosas que aumenten su nivel de [place], por [place], haga [place], salga a caminar o suba y [place] escaleras. Pregntele al mdico cmo puede aumentar la actividad en su vida cotidiana.  Medicamentos. Tome todos los [employer] como le indic el mdico.  El mdico puede recetarle medicamentos para ayudar a Management consultant colesterol y reducir el riesgo de enfermedades cardacas.  Si tiene varios factores de riesgo, tal vez tenga que tomar medicamentos, incluso si las concentraciones son normales.   Esta informacin no tiene Marine scientist el consejo del mdico.  Asegrese de hacerle al mdico cualquier pregunta que tenga.   Document Released: 04/26/2005 Document Revised: 08/07/2014 Elsevier Interactive Patient Education 2016 [employer]. Pravastatin tablets Qu es [place] medicamento? La PRAVASTATINA es conocido como un inhibidor de la HMG-CoA reductasa o 'estatias'. Reduce  el nivel de colesterol y triglicridos en la sangre. Este medicamento tambin puede reducir el riesgo de Insurance risk surveyor ataques cardiacos, derrame cerebral u otros problemas de la salud en pacientes que corren el riesgo de padecer una enfermedad cardiaca. Lucius Conn y cambios a su estilo de vida son a menudo utilizados con Fish farm manager. Este medicamento puede ser utilizado para otros usos; si tiene alguna pregunta consulte con su proveedor de atencin mdica o con su farmacutico. Qu le debo informar a mi profesional de la salud antes de tomar este medicamento? Necesita saber si usted presenta alguno de los siguientes problemas o situaciones: -ingerir bebidas alcohlicas con frecuencia -enfermedad renal -enfermedad heptica -dolores o debilidades musculares -otra condicin mdica -una reaccin alrgica o inusual a la pravastatina, a otros medicamentos, alimentos, colorantes o conservadores -si est embarazada o buscando quedar embarazada -si est amamantando a un beb Cmo debo utilizar este medicamento? Tome las tabletas de pravastatina por va oral. Ingiera las tabletas con un vaso de agua. Puede tomar la pravastatina en cualquier momento del da, con o sin alimentos. Siga las instrucciones de la etiqueta del [place]. Tome sus dosis a intervalos regulares. No tome su medicamento con una frecuencia mayor a la indicada. Hable con su pediatra para informarse acerca del uso de este medicamento en nios. Puede requerir atencin especial. La pravastatina ha sido utilizada por nios a partir de los 8 aos de [place]. Sobredosis: Pngase en contacto inmediatamente con un centro toxicolgico o una sala de urgencia si usted cree que haya tomado demasiado medicamento. ATENCIN: [employer] es solo para usted. No comparta este medicamento con nadie. Qu sucede si me olvido de una dosis? Si olvida una dosis, tmela lo antes posible. Si es casi la hora de la prxima dosis, tome slo esa dosis. No tome  dosis adicionales o dobles. Qu puede interactuar con este medicamento? No tome esta medicina con ninguno de los siguientes medicamentos: -medicamentos a base de hierbas, tales como levadura roja de arroz Esta medicina tambin puede interactuar con los siguientes medicamentos: -alcohol -medicamentos antivirales para el VIH o SIDA -ciertos medicamentos para infecciones micticas, tales como quetoconazol e itraconazol -colchicina -ciclosporina -otros medicamentos para el alto nivel de colesterol -algunos antibiticos, tales como claritromicina, eritromicina y telitromicina Puede ser que esta lista no menciona todas las posibles interacciones. Informe a su profesional de KB Home	[place] de [employer] productos a base de hierbas, medicamentos de [place] o suplementos nutritivos que est tomando. Si usted fuma, consume bebidas alcohlicas o si utiliza drogas ilegales, indqueselo tambin a su profesional de KB Home	[place]. Algunas sustancias pueden interactuar con su medicamento. A qu debo estar atento al usar Coca-Cola? Visite a su mdico o a su profesional de la salud para chequeos peridicos. Puede necesitar exmenes regulares para asegurarse de que el hgado est funcionando bien. Informe a su mdico o a su profesional de la salud tan pronto como pueda si tiene debilidad, sensibilidad o dolores musculares que no tienen explicacin, especialmente si tambin experimenta fiebre y cansancio. Su mdico o profesional de Technical sales engineer puede informarle que deje de tomar este medicamento si desarrolla problemas musculares. Si sus problemas musculares no desaparecen despus de parar este medicamento, comunquese con su profesional de KB Home	[place]. Este medicamento es  slo una parte de un programa integral para la salud de corazn Su mdico o dietista pueden sugerirle una dieta con bajo contenido de colesterol y de grasa para ayudarle. Evite el alcohol y Copy, y [place] un programa adecuado de ejercicios fsicos. No  utilice este medicamento si est embarazada o amamantando. Existe la posibilidad de efectos secundarios graves a un beb sin nacer o a Oncologist. Para ms informacin hable con su farmacutico o su profesional de KB Home	[place]. Este medicamento puede afectar su nivel de Dispensing optician. Si tiene diabetes, consulte a su mdico o a su profesional de la salud antes de cambiar su dieta o la dosis de su medicamento para la diabetes. Si va a someterse a una operacin, informe a su profesional de la salud que est tomando Coca-Cola. Qu efectos secundarios puedo tener al [employer] este medicamento? Efectos secundarios que debe informar a su mdico o a Barrister's clerk de la salud tan pronto como sea posible: -Chief of Staff como erupcin cutnea, picazn o urticarias, hinchazn de la cara, labios o lengua -orina de color amarillo oscuro -fiebre -calambres, debilidad o dolores musculares -enrojecimiento, formacin de ampollas, descamacin o distensin de la piel, inclusive dentro de la boca -dificultad para orinar o cambios en el volumen de orina -cansancio o debilidad inusual -color amarillento de los ojos o la piel Efectos secundarios que, por lo general, no requieren Geophysical data processor (debe informarlos a su mdico o a su profesional de la salud si persisten o si son molestos): -gases -dolor de cabeza -acidez de estmago -indigestin -dolor de Paramedic Puede ser que esta lista no menciona todos los posibles efectos secundarios. Comunquese a su mdico por asesoramiento mdico [employer]. Usted puede informar los efectos secundarios a la FDA por telfono al 1-800-FDA-1088. Dnde debo guardar mi medicina? Mantngala fuera del alcance de los nios. Gurdela a [employer] 15 y 23 grados C (49 y 47 grados F). Protjala de la luz. Mantenga el envase bien cerrado. Deseche todo el medicamento que no haya utilizado, despus de la fecha de vencimiento. ATENCIN:  Este folleto es un resumen. Puede ser que no cubra toda la posible informacin. Si usted tiene preguntas acerca de esta medicina, consulte con su mdico, su farmacutico o su profesional de Technical sales engineer.    2016, Elsevier/Gold Standard. (2014-09-08 00:00:00)

## 2016-01-04 NOTE — Progress Notes (Signed)
HPI: Patient is a 60 year old was seen in the office for her annual exam on 11/23/2015. She is here to discuss abnormal labs from that office visit in reference to her lipid profile and also her worsening hot flashes and insomnia since she was switched from Premarin 0.625 mg daily to transdermal Vivelle Dot 0.1 mg twice a week. Review of patient's record indicated that [place] country [place] well she had an abdominal hysterectomy with ovarian conservation and bladder suspension with mesh as a result of symptomatic fibroid uterus and urinary incontinence.   ROS: A ROS was performed and pertinent positives and negatives are included in the history.  GENERAL: No fevers or chills. HEENT: No change in vision, no earache, sore throat or sinus congestion. NECK: No pain or stiffness. CARDIOVASCULAR: No chest pain or pressure. No palpitations. PULMONARY: No shortness of breath, cough or wheeze. GASTROINTESTINAL: No abdominal pain, nausea, vomiting or diarrhea, melena or bright red blood per rectum. GENITOURINARY: No urinary frequency, urgency, hesitancy or dysuria. MUSCULOSKELETAL: No joint or muscle pain, no back pain, no recent trauma. DERMATOLOGIC: No rash, no itching, no lesions. ENDOCRINE: No polyuria, polydipsia, no heat or cold intolerance. No recent change in weight. HEMATOLOGICAL: No anemia or easy bruising or bleeding. NEUROLOGIC: No headache, seizures, numbness, tingling or weakness. PSYCHIATRIC: No depression, no loss of interest in normal activity or change in sleep pattern.   PE: Complete physical exam was here in the office on 11/23/2015.  Patient's recent lipid profile 2016 and last month 2017 as follows: Results for Robin Boyd, Robin Boyd (MRN [id number]) as of 01/04/2016 16:43  Ref. Range 10/08/2014 08:45 12/22/2015 09:02  Cholesterol Latest Ref Range: 125-200 mg/dL 283 (H) 285 (H)  Triglycerides Latest Ref Range: <150 mg/dL 107 114  HDL Cholesterol Latest Ref Range: >=46 mg/dL 56 58  LDL  (calc) Latest Ref Range: <130 mg/dL 206 (H) 204 (H)  VLDL Latest Ref Range: <30 mg/dL 21 23  Total CHOL/HDL Ratio Latest Ref Range: <=5.0 Ratio 5.1 4.9    Patient with persistently elevated LDL despite exercise and diet. We had discussed different treatment options and my recommendation is to prescribe her Pravachol 10 mg daily to help decrease her LDL. Also for her vasomotor symptoms she would like to return back to the Premarin 0.625 mg 1 by mouth daily and discontinue the Vivelle-Dot 0.1 mg patch twice a week.    Assessment Plan: #1 menopausal hot flashes and insomnia. We'll switch from [place] back to Premarin 0.625 mg daily which she tolerated well. This is patient's fifth year or on estrogen replacement therapy. We went through a detail discussion on the women's health initiative study outlining the risks benefits and pros and cons of estrogen. Potential risk of DVT and pulmonary embolism was discussed. #2 hyperlipidemia (elevated LDL and total cholesterol) will be started on Pravachol 10 mg daily the risks benefits and pros and cons were discussed to include small risk of myalgia, heartburn, fatigue, headache, diarrhea, nausea and vomiting all less than 10%. Also potential risk after long-term use of diabetes. Patient will return back in 6 months for a follow-up fasting lipid profile as well as liver function tests SGOT and SGPT.  Literature information on both the above was provided in Spanish and all questions are answered.    Greater than 50% of time was spent in counseling and coordinating care of this patient.   Time of consultation: 15   Minutes.

## 2016-01-05 ENCOUNTER — Other Ambulatory Visit: Payer: Self-pay | Admitting: Gynecology

## 2016-01-05 DIAGNOSIS — Z1231 Encounter for screening mammogram for malignant neoplasm of breast: Secondary | ICD-10-CM

## 2016-01-17 ENCOUNTER — Ambulatory Visit
Admission: RE | Admit: 2016-01-17 | Discharge: 2016-01-17 | Disposition: A | Discharge status: Home / Self Care | Payer: BLUE CROSS/BLUE SHIELD | Source: Ambulatory Visit | Attending: Gynecology | Admitting: Gynecology

## 2016-01-17 DIAGNOSIS — Z1231 Encounter for screening mammogram for malignant neoplasm of breast: Secondary | ICD-10-CM

## 2016-01-20 ENCOUNTER — Other Ambulatory Visit: Payer: Self-pay | Admitting: Gynecology

## 2016-01-20 DIAGNOSIS — R928 Other abnormal and inconclusive findings on diagnostic imaging of breast: Secondary | ICD-10-CM

## 2016-01-26 ENCOUNTER — Ambulatory Visit
Admission: RE | Admit: 2016-01-26 | Discharge: 2016-01-26 | Disposition: A | Discharge status: Home / Self Care | Payer: BLUE CROSS/BLUE SHIELD | Source: Ambulatory Visit | Attending: Gynecology | Admitting: Gynecology

## 2016-01-26 DIAGNOSIS — R928 Other abnormal and inconclusive findings on diagnostic imaging of breast: Secondary | ICD-10-CM

## 2016-06-20 ENCOUNTER — Other Ambulatory Visit: Payer: Self-pay | Admitting: Gynecology

## 2016-06-20 DIAGNOSIS — N63 Unspecified lump in unspecified breast: Secondary | ICD-10-CM

## 2016-06-30 ENCOUNTER — Other Ambulatory Visit: Payer: Self-pay | Admitting: *Deleted

## 2016-06-30 DIAGNOSIS — E7849 Other hyperlipidemia: Secondary | ICD-10-CM

## 2016-06-30 DIAGNOSIS — R945 Abnormal results of liver function studies: Secondary | ICD-10-CM

## 2016-07-03 ENCOUNTER — Other Ambulatory Visit: Payer: Self-pay | Admitting: Gynecology

## 2016-07-03 ENCOUNTER — Ambulatory Visit
Admission: RE | Admit: 2016-07-03 | Discharge: 2016-07-03 | Disposition: A | Discharge status: Home / Self Care | Payer: BLUE CROSS/BLUE SHIELD | Source: Ambulatory Visit | Attending: Gynecology | Admitting: Gynecology

## 2016-07-03 DIAGNOSIS — N63 Unspecified lump in unspecified breast: Secondary | ICD-10-CM

## 2016-07-03 DIAGNOSIS — N6311 Unspecified lump in the right breast, upper outer quadrant: Secondary | ICD-10-CM | POA: Diagnosis not present

## 2016-07-10 ENCOUNTER — Other Ambulatory Visit: Payer: BLUE CROSS/BLUE SHIELD

## 2016-07-10 DIAGNOSIS — E784 Other hyperlipidemia: Secondary | ICD-10-CM | POA: Diagnosis not present

## 2016-07-10 DIAGNOSIS — E7849 Other hyperlipidemia: Secondary | ICD-10-CM

## 2016-07-10 DIAGNOSIS — K7689 Other specified diseases of liver: Secondary | ICD-10-CM | POA: Diagnosis not present

## 2016-07-10 DIAGNOSIS — R945 Abnormal results of liver function studies: Secondary | ICD-10-CM

## 2016-07-10 LAB — AST: AST: 19 U/L (ref 10–35)

## 2016-07-10 LAB — LIPID PANEL
CHOL/HDL RATIO: 4.9 ratio (ref <5.0)
Cholesterol: 220 mg/dL — ABNORMAL HIGH (ref <200)
HDL: 45 mg/dL — AB (ref >50)
LDL CALC: 130 mg/dL — AB (ref <100)
TRIGLYCERIDES: 227 mg/dL — AB (ref <150)
VLDL: 45 mg/dL — ABNORMAL HIGH (ref <30)

## 2016-07-10 LAB — ALT: ALT: 17 U/L (ref 6–29)

## 2016-11-17 ENCOUNTER — Other Ambulatory Visit: Payer: Self-pay

## 2016-11-17 MED ORDER — ESTROGENS CONJUGATED 0.625 MG PO TABS
0.6250 mg | ORAL_TABLET | Freq: Every day | ORAL | 0 refills | Status: DC
Start: 2016-11-17 — End: 2016-11-23

## 2016-11-23 ENCOUNTER — Encounter: Payer: Self-pay | Admitting: Gynecology

## 2016-11-23 ENCOUNTER — Ambulatory Visit (INDEPENDENT_AMBULATORY_CARE_PROVIDER_SITE_OTHER): Payer: BLUE CROSS/BLUE SHIELD | Admitting: Gynecology

## 2016-11-23 VITALS — BP 130/80 | Ht 63.0 in | Wt 146.0 lb

## 2016-11-23 DIAGNOSIS — Z780 Asymptomatic menopausal state: Secondary | ICD-10-CM | POA: Diagnosis not present

## 2016-11-23 DIAGNOSIS — E782 Mixed hyperlipidemia: Secondary | ICD-10-CM | POA: Diagnosis not present

## 2016-11-23 DIAGNOSIS — Z01419 Encounter for gynecological examination (general) (routine) without abnormal findings: Secondary | ICD-10-CM | POA: Diagnosis not present

## 2016-11-23 DIAGNOSIS — Z79890 Hormone replacement therapy (postmenopausal): Secondary | ICD-10-CM | POA: Diagnosis not present

## 2016-11-23 MED ORDER — ESTROGENS CONJUGATED 0.625 MG PO TABS: 0.6250 mg | ORAL_TABLET | Freq: Every day | ORAL | 4 refills | Status: DC

## 2016-11-23 NOTE — Progress Notes (Signed)
Robin Boyd [birth year] [id number]   History:    61 y.o.  for annual gyn exam  who is doing well on Premarin 0.625 mg daily which is help with her vasomotor symptoms and vaginal atrophy.Patient had moved to [place] from [place]. Patient reports no past history of any abnormal Pap smears prior to her abdominal hysterectomy with ovarian conservation and bladder suspension with mesh as a result of systematic fibroid uterus and urinary incontinence. Patient reports normal colonoscopy 10 years ago in [place] and her bone density study was normal here in 2017. She has silicon implants. Review of her records indicated she had hyper lipidemia whereby her total cholesterol was elevated at 283 and her LDL was at 206 and she was started on Pravachol 10 mg daily and the following year her lipid profile had slightly improved but she had discontinued the medication on her own.  Past medical history,surgical history, family history and social history were all reviewed and documented in the EPIC chart.  Gynecologic History Patient's last menstrual period was 10/06/2010. Contraception: status post hysterectomy Last Pap:  2016. Results were: normal Last mammogram:  2017. Results were: normal  Obstetric History OB History  Gravida Para Term Preterm AB Living  2 2       2   SAB TAB Ectopic Multiple Live Births               # Outcome Date GA Lbr Len/2nd Weight Sex Delivery Anes PTL Lv  2 Para           1 Para                ROS: A ROS was performed and pertinent positives and negatives are included in the history.  GENERAL: No fevers or chills. HEENT: No change in vision, no earache, sore throat or sinus congestion. NECK: No pain or stiffness. CARDIOVASCULAR: No chest pain or pressure. No palpitations. PULMONARY: No shortness of breath, cough or wheeze. GASTROINTESTINAL: No abdominal pain, nausea, vomiting or diarrhea, melena or bright red blood per rectum. GENITOURINARY: No urinary frequency,  urgency, hesitancy or dysuria. MUSCULOSKELETAL: No joint or muscle pain, no back pain, no recent trauma. DERMATOLOGIC: No rash, no itching, no lesions. ENDOCRINE: No polyuria, polydipsia, no heat or cold intolerance. No recent change in weight. HEMATOLOGICAL: No anemia or easy bruising or bleeding. NEUROLOGIC: No headache, seizures, numbness, tingling or weakness. PSYCHIATRIC: No depression, no loss of interest in normal activity or change in sleep pattern.     Exam: chaperone present  BP 130/80   Ht 5\' 3"  (1.6 m)   Wt 146 lb (66.2 kg)   LMP 10/06/2010   BMI 25.86 kg/m   Body mass index is 25.86 kg/m.  General appearance : Well developed well nourished female. No acute distress HEENT: Eyes: no retinal hemorrhage or exudates,  Neck supple, trachea midline, no carotid bruits, no thyroidmegaly Lungs: Clear to auscultation, no rhonchi or wheezes, or rib retractions  Heart: Regular rate and rhythm, no murmurs or gallops Breast:Examined in sitting and supine position were symmetrical in appearance, no palpable masses or tenderness,  no skin retraction, no nipple inversion, no nipple discharge, no skin discoloration, no axillary or supraclavicular lymphadenopathy Abdomen: no palpable masses or tenderness, no rebound or guarding Extremities: no edema or skin discoloration or tenderness  Pelvic:  Bartholin, Urethra, Skene Glands: Within normal limits             Vagina: No gross lesions or discharge  Cervix: absent  Uterus  Absent  Adnexa  Without masses or tenderness  Anus and perineum  normal   Rectovaginal  normal sphincter tone without palpated masses or tenderness             Hemoccult  will schedule colonoscopy this year     Assessment/Plan:  61 y.o. female for annual exam  doing well on Premarin 0.625 mg daily she is going to begin to try to taper it down by taking half a tablet daily which would be equivalent to 0.3 mg daily. We discussed importance of calcium vitamin D and  weightbearing exercises for osteoporosis prevention. She will need a bone density study next year. She is due for mammogram this December. She will return back to the office later this week in a fasting state for fasting lipid profile, comprehensive metabolic panel, and CBC. If her lipid profile indicated once again hyperlipidemia we are going to start her again on the Pravachol 10 mg daily and return to the office 3 months after initiating treatment to check her liver function tests and follow-up on her lipid profile. She was given the name of community gastroenterologist to schedule her overdue colonoscopy.  Terrance Mass MD, 3:22 PM 11/23/2016

## 2016-11-23 NOTE — Patient Instructions (Signed)
Control del colesterol  Los niveles de colesterol en el organismo estn determinados significativamente por su dieta. Los niveles de colesterol tambin se relacionan con la enfermedad cardaca. El material que sigue ayuda a Event organiser relacin y a Physiological scientist qu puede hacer para mantener su corazn sano. No todo el colesterol es [place]. Las lipoprotenas de baja densidad (LDL) forman el colesterol "malo". El colesterol malo puede ocasionar depsitos de grasa que se acumulan en el interior de las arterias. Las lipoprotenas de alta densidad (HDL) es el colesterol "bueno". Ayuda a remover el colesterol LDL "malo" de la [place]. El colesterol es un factor de riesgo muy importante para la enfermedad cardaca. Otros factores de riesgo son la hipertensin arterial, el hbito de fumar, el estrs, la herencia y [place].   El msculo cardaco obtiene el suministro de sangre a travs de las arterias coronarias. Si su colesterol LDL ("malo") est elevado y el HDL ("bueno") es bajo, tiene un factor de riesgo para que se formen depsitos de Lobbyist en las arterias coronarias (los vasos sanguneos que suministran sangre al corazn). Esto hace que haya menos lugar para que la sangre circule. Sin la suficiente sangre y oxgeno, el msculo cardaco no puede funcionar correctamente, y usted podr sentir dolores en el pecho (angina pectoris). Cuando una arteria coronaria se cierra completamente, una parte del msculo cardaco puede morir (infarto de miocardio).  CONTROL DEL COLESTEROL Cuando el profesional que lo asiste enva la sangre al laboratorio para Armed forces logistics/support/administrative officer nivel de colesterol, puede realizarle tambin un perfil completo de los lpidos. Con esta prueba, se puede determinar la cantidad total de colesterol, as como los niveles de LDL y HDL. Los triglicridos son un tipo de grasa que circula en la sangre y que tambin puede utilizarse  para determinar el riesgo de enfermedad cardaca. En la siguiente tabla se establecen los nmeros ideales: Prueba: Colesterol total  Menos de 200 mg/dl.  Prueba: LDL "colesterol malo"  Menos de 100 mg/dl.   Menos de 70 mg/dl si tiene riesgo muy elevado de sufrir un ataque cardaco o muerte cardaca sbita.  Prueba: HDL "colesterol bueno"  Mujeres: Ms de 50 mg/dl.   Hombres: Ms de 40 mg/dl.  Prueba: Trigliceridos  Menos de 034 mg/dl.    CONTROL DEL COLESTEROL CON DIETA Aunque factores como el ejercicio y el estilo de vida son importantes, la "primera lnea de ataque" es la dieta. Esto se debe a que se sabe que ciertos alimentos hacen subir el colesterol y otros lo [place]. El objetivo debe ser [employer] alimentos, de modo que tengan un efecto sobre el colesterol y, an ms importante, Training and development officer las grasas saturadas y trans con otros tipos de grasas, como las monoinsaturadas y las poliinsaturadas y cidos grasos omega-3 . En promedio, una persona no debe consumir ms de 15 a 17 g de grasas saturadas por [employer]. Las grasas saturadas y trans se consideran grasas "malas", ya que elevan el colesterol LDL. Las grasas saturadas se encuentran principalmente en productos animales como carne, [place] y crema. Pero esto no significa que usted Regulatory affairs officer todas sus comidas favoritas. Actualmente, como lo muestra el cuadro que figura al final de este documento, hay sustitutos de buen sabor, bajos en grasas y en colesterol, para la mayora de los alimentos que a usted Biomedical engineer. Elija aquellos alimentos alternativos que sean bajos en grasas o sin grasas. Elija cortes de carne del cuarto trasero o lomo ya que estos cortes son los que tienen menor cantidad de Lobbyist  y colesterol. El pollo (sin piel), el pescado, la carne de ternera, y la [place] molida son excelentes opciones. Elimine las carnes [employer] o el salami. Los [employer] o nada de grasas saturadas. Cuando  consuma carne [place], carne de aves de corral, o pescado, hgalo en porciones de 85 gramos (3 onzas). Las grasas trans tambin se llaman "aceites parcialmente hidrogenados". Son aceites manipulados cientficamente de [place] que son slidos a Engineer, water, tienen una larga vida y Therapist, art sabor y la textura de los alimentos a los que se Pension scheme manager. Las grasas trans se encuentran en la [place], [place], crackers y alimentos horneados.  Para hornear y cocinar, el aceite es un excelente sustituto para la [place]. Los aceites monoinsaturados tienen un beneficio particular, ya que se cree que disminuyen el colesterol LDL (colesterol malo) y elevan el HDL. Deber evitar los aceites tropicales saturados como el de coco y el de [place].  Recuerde, adems, que puede comer sin restricciones los grupos de alimentos que son naturalmente libres de grasas saturadas y Physicist, medical trans, entre los que se incluyen el pescado, las frutas (excepto el [place]), verduras, frijoles, cereales (cebada, arroz, [place], trigo) y las pastas (sin salsas con crema)   IDENTIFIQUE LOS ALIMENTOS QUE DISMINUYEN EL COLESTEROL  Pueden disminuir el colesterol las fibras solubles que estn en las frutas, como las [place], en los vegetales como el brcoli, las patatas y las zanahorias; en las legumbres como frijoles, guisantes y Engineer, technical sales; y en los cereales como la cebada. Los alimentos fortificados con fitosteroles tambin Forensic psychologist. Debe consumir al menos 2 g de estos alimentos a diario para Software engineer de disminucin de [place].  En el supermercado, lea las etiquetas de los envases para identificar los alimentos bajos en grasas saturadas, libres de grasas trans y bajos en [place], . Elija quesos que tengan solo de 2 a 3 g de grasa saturada por onza (28,35 g). Use una margarina que no dae el corazn, [place] de grasas trans o aceite parcialmente hidrogenado. Al comprar alimentos horneados (galletitas dulces y  Administrator) evite el aceite parcialmente hidrogenado. Los panes y bollos debern ser de granos enteros (harina de maz o de avena entera, en lugar de "harina" o "harina enriquecida"). Compre sopas en lata que no sean cremosas, con bajo contenido de sal y sin grasas adicionadas.   TCNICAS DE PREPARACIN DE LOS ALIMENTOS  Nunca fra los alimentos en aceite abundante. Si debe frer, hgalo en poco aceite y removiendo [place], porque as se utilizan muy pocas grasas, o utilice un spray antiadherente. Cuando le sea posible, hierva, hornee o ase las carnes y cocine los vegetales al vapor. En vez de [employer] con mantequilla o [place], utilice limn y hierbas, pur de Archivist y canela (para las calabazas y batatas), yogurt y salsa descremados y aderezos para ensaladas bajos en contenido graso.   BAJO EN GRASAS SATURADAS / SUSTITUTOS BAJOS EN GRASA  [place] / Grasas saturadas (g)  Evite: Bife, corte graso (3 oz/85 g) / 11 g   Elija: Bife, corte magro (3 oz/85 g) / 4 g   Evite: [place] (3 oz/85 g) / 7 g   Elija:  [place] magra (3 oz/85 g) / 5 g   Evite: Jamn (3 oz/85 g) / 6 g   Elija:  Jamn magro (3 oz/85 g) / 2.4 g   Evite: Pollo, con piel (3 oz/85 g), Carne oscura / 4 g   Elija:  Pollo, sin piel (  3 oz/85 g), Carne oscura / 2 g   Evite: Pollo, con piel (3 oz/85 g), Carne magra / 2.5 g   Elija: Pollo, sin piel (3 oz/85 g), Carne magra / 1 g  Lcteos / Grasas saturadas (g)  Evite: Leche entera (1 taza) / 5 g   Elija: Leche con bajo contenido de grasa, 2% (1 taza) / 3 g   Elija: Leche con bajo contenido de grasa, 1% (1 taza) / 1.5 g   Elija: Leche descremada (1 taza) / 0.3 g   Evite: Queso duro (1 oz/28 g) / 6 g   Elija: Queso descremado (1 oz/28 g) / 2-3 g   Evite: Queso cottage, 4% grasa (1 taza)/ 6.5 g   Elija: Queso cottage con bajo contenido de grasa, 1% grasa (1 taza)/ 1.5 g   Evite: Helado (1 taza) / 9 g   Elija: Sorbete (1 taza) / 2.5 g    Elija: Yogurt helado sin contenido de grasa (1 taza) / 0.3 g   Elija: Barras de fruta congeladas / vestigios   Evite: Crema batida (1 cucharada) / 3.5 g   Elija: Batidos glac sin lcteos (1 cucharada) / 1 g  Condimentos / Grasas saturadas (g)  Evite: Mayonesa (1 cucharada) / 2 g   Elija: Mayonesa con bajo contenido de grasa (1 cucharada) / 1 g   Evite: [place] (1 cucharada) / 7 g   Elija: Margarina extra light (1 cucharada) / 1 g   Evite: Aceite de coco (1 cucharada) / 11.8 g   Elija: Aceite de oliva (1 cucharada) / 1.8 g   Elija: Aceite de maz (1 cucharada) / 1.7 g   Elija: Aceite de crtamo (1 cucharada) / 1.2 g   Elija: Aceite de girasol (1 cucharada) / 1.4 g   Elija: Aceite de soja (1 cucharada) / 2.4 g   Elija: Aceite de canola (1 cucharada) / 1 g  Document Released: 07/17/2005 Document Revised: 03/29/2011 [facility] Patient Information 2012 [place], [place].

## 2016-12-13 ENCOUNTER — Encounter: Payer: Self-pay | Admitting: Gynecology

## 2017-03-16 DIAGNOSIS — H0015 Chalazion left lower eyelid: Secondary | ICD-10-CM | POA: Diagnosis not present

## 2017-08-01 DIAGNOSIS — H43812 Vitreous degeneration, left eye: Secondary | ICD-10-CM | POA: Diagnosis not present

## 2017-08-20 DIAGNOSIS — H43813 Vitreous degeneration, bilateral: Secondary | ICD-10-CM | POA: Diagnosis not present

## 2017-08-20 DIAGNOSIS — H43392 Other vitreous opacities, left eye: Secondary | ICD-10-CM | POA: Diagnosis not present

## 2017-08-20 DIAGNOSIS — H35372 Puckering of macula, left eye: Secondary | ICD-10-CM | POA: Diagnosis not present

## 2017-08-24 DIAGNOSIS — J019 Acute sinusitis, unspecified: Secondary | ICD-10-CM | POA: Diagnosis not present

## 2017-08-24 DIAGNOSIS — H6693 Otitis media, unspecified, bilateral: Secondary | ICD-10-CM | POA: Diagnosis not present

## 2017-08-24 DIAGNOSIS — J040 Acute laryngitis: Secondary | ICD-10-CM | POA: Diagnosis not present

## 2017-09-18 DIAGNOSIS — R509 Fever, unspecified: Secondary | ICD-10-CM | POA: Diagnosis not present

## 2017-09-18 DIAGNOSIS — J209 Acute bronchitis, unspecified: Secondary | ICD-10-CM | POA: Diagnosis not present

## 2017-09-18 DIAGNOSIS — R300 Dysuria: Secondary | ICD-10-CM | POA: Diagnosis not present

## 2017-09-25 ENCOUNTER — Encounter (HOSPITAL_COMMUNITY): Payer: Self-pay | Admitting: Emergency Medicine

## 2017-09-25 ENCOUNTER — Other Ambulatory Visit: Payer: Self-pay

## 2017-09-25 ENCOUNTER — Emergency Department (HOSPITAL_COMMUNITY)
Admission: EM | Admit: 2017-09-25 | Discharge: 2017-09-25 | Disposition: A | Discharge status: Home / Self Care | Payer: BLUE CROSS/BLUE SHIELD | Attending: Emergency Medicine | Admitting: Emergency Medicine

## 2017-09-25 DIAGNOSIS — R197 Diarrhea, unspecified: Secondary | ICD-10-CM | POA: Diagnosis not present

## 2017-09-25 DIAGNOSIS — R112 Nausea with vomiting, unspecified: Secondary | ICD-10-CM

## 2017-09-25 DIAGNOSIS — K2970 Gastritis, unspecified, without bleeding: Secondary | ICD-10-CM | POA: Diagnosis not present

## 2017-09-25 DIAGNOSIS — Z79899 Other long term (current) drug therapy: Secondary | ICD-10-CM | POA: Diagnosis not present

## 2017-09-25 DIAGNOSIS — R1084 Generalized abdominal pain: Secondary | ICD-10-CM | POA: Diagnosis not present

## 2017-09-25 LAB — COMPREHENSIVE METABOLIC PANEL
ALK PHOS: 84 U/L (ref 38–126)
ALT: 27 U/L (ref 14–54)
ANION GAP: 11 (ref 5–15)
AST: 22 U/L (ref 15–41)
Albumin: 3.6 g/dL (ref 3.5–5.0)
BILIRUBIN TOTAL: 0.4 mg/dL (ref 0.3–1.2)
BUN: 23 mg/dL — ABNORMAL HIGH (ref 6–20)
CALCIUM: 8.5 mg/dL — AB (ref 8.9–10.3)
CO2: 24 mmol/L (ref 22–32)
Chloride: 104 mmol/L (ref 101–111)
Creatinine, Ser: 0.92 mg/dL (ref 0.44–1.00)
GLUCOSE: 153 mg/dL — AB (ref 65–99)
Potassium: 4.7 mmol/L (ref 3.5–5.1)
Sodium: 139 mmol/L (ref 135–145)
TOTAL PROTEIN: 7 g/dL (ref 6.5–8.1)

## 2017-09-25 LAB — CBC
HCT: 46 % (ref 36.0–46.0)
HEMOGLOBIN: 14.6 g/dL (ref 12.0–15.0)
MCH: 28.2 pg (ref 26.0–34.0)
MCHC: 31.7 g/dL (ref 30.0–36.0)
MCV: 88.8 fL (ref 78.0–100.0)
Platelets: 427 10*3/uL — ABNORMAL HIGH (ref 150–400)
RBC: 5.18 MIL/uL — AB (ref 3.87–5.11)
RDW: 14.4 % (ref 11.5–15.5)
WBC: 19.8 10*3/uL — AB (ref 4.0–10.5)

## 2017-09-25 LAB — LIPASE, BLOOD: Lipase: 29 U/L (ref 11–51)

## 2017-09-25 MED ORDER — ONDANSETRON HCL 4 MG/2ML IJ SOLN
4.0000 mg | Freq: Once | INTRAMUSCULAR | Status: AC
  Administered 2017-09-25: 4 mg via INTRAVENOUS
  Filled 2017-09-25: qty 2

## 2017-09-25 MED ORDER — DICYCLOMINE HCL 20 MG PO TABS: 20.0000 mg | ORAL_TABLET | Freq: Three times a day (TID) | ORAL | 0 refills | Status: DC | PRN

## 2017-09-25 MED ORDER — ONDANSETRON 4 MG PO TBDP: 4.0000 mg | ORAL_TABLET | Freq: Four times a day (QID) | ORAL | 0 refills | Status: DC | PRN

## 2017-09-25 MED ORDER — DICYCLOMINE HCL 10 MG/ML IM SOLN
20.0000 mg | Freq: Once | INTRAMUSCULAR | Status: AC
  Administered 2017-09-25: 20 mg via INTRAMUSCULAR
  Filled 2017-09-25: qty 2

## 2017-09-25 MED ORDER — LOPERAMIDE HCL 2 MG PO CAPS
2.0000 mg | ORAL_CAPSULE | Freq: Once | ORAL | Status: AC
  Administered 2017-09-25: 2 mg via ORAL
  Filled 2017-09-25: qty 1

## 2017-09-25 MED ORDER — ONDANSETRON HCL 4 MG/2ML IJ SOLN: 4.0000 mg | Freq: Once | INTRAMUSCULAR | Status: DC | PRN

## 2017-09-25 MED ORDER — SODIUM CHLORIDE 0.9 % IV BOLUS (SEPSIS)
1000.0000 mL | Freq: Once | INTRAVENOUS | Status: AC
  Administered 2017-09-25: 1000 mL via INTRAVENOUS

## 2017-09-25 MED ORDER — LOPERAMIDE HCL 2 MG PO CAPS: 2.0000 mg | ORAL_CAPSULE | Freq: Four times a day (QID) | ORAL | 0 refills | Status: DC | PRN

## 2017-09-25 NOTE — ED Triage Notes (Addendum)
Pt reports having nausea, vomiting, and diarrhea since 8pm. EMS reported given 4mg  Zofran IVP during transport with moderate relief of abd pain.

## 2017-09-25 NOTE — ED Notes (Signed)
Patient given water for fluid/PO challenge.

## 2017-09-25 NOTE — Discharge Instructions (Signed)
You may alternate Tylenol 1000 mg every 6 hours as needed for pain and Ibuprofen 800 mg every 8 hours as needed for pain.  Please take Ibuprofen with food. ° °

## 2017-09-25 NOTE — ED Provider Notes (Signed)
TIME SEEN: 4:44 AM  CHIEF COMPLAINT: Nausea, vomiting, diarrhea  HPI: Patient is a 62 year old female with history of hyperlipidemia who presents to the emergency department with nausea, vomiting and diarrhea that started at 8 PM last night.  Had diffuse abdominal cramping but this has now resolved.  No fevers but did have cold sweats and chills.  States her grandson has had similar symptoms.  She is status post hysterectomy.  No dysuria, hematuria, vaginal bleeding or discharge.  ROS: See HPI Constitutional: no fever  Eyes: no drainage  ENT: no runny nose   Cardiovascular:  no chest pain  Resp: no SOB  GI: Diarrhea and vomiting GU: no dysuria Integumentary: no rash  Allergy: no hives  Musculoskeletal: no leg swelling  Neurological: no slurred speech ROS otherwise negative  PAST MEDICAL HISTORY/PAST SURGICAL HISTORY:  Past Medical History:  Diagnosis Date  . Hyperlipidemia     MEDICATIONS:  Prior to Admission medications   Medication Sig Start Date End Date Taking? Authorizing Provider  estrogens, conjugated, (PREMARIN) 0.625 MG tablet Take 1 tablet (0.625 mg total) by mouth daily. Take daily for 21 days then do not take for 7 days. 11/23/16   Terrance Mass, MD    ALLERGIES:  No Known Allergies  SOCIAL HISTORY:  Social History   Tobacco Use  . Smoking status: Never Smoker  . Smokeless tobacco: Never Used  Substance Use Topics  . Alcohol use: Yes    Alcohol/week: 0.0 oz    Comment: SOCIAL    FAMILY HISTORY: History reviewed. No pertinent family history.  EXAM: BP 102/72 (BP Location: Left Arm)   Pulse (!) 102   Temp 98.8 F (37.1 C) (Oral)   Resp 18   Ht 5\' 2"  (1.575 m)   Wt 67 kg (147 lb 11.3 oz)   LMP 10/06/2010   SpO2 96%   BMI 27.02 kg/m  CONSTITUTIONAL: Alert and oriented and responds appropriately to questions. Well-appearing; well-nourished HEAD: Normocephalic EYES: Conjunctivae clear, pupils appear equal, EOMI ENT: normal nose; moist mucous  membranes NECK: Supple, no meningismus, no nuchal rigidity, no LAD  CARD: RRR; S1 and S2 appreciated; no murmurs, no clicks, no rubs, no gallops RESP: Normal chest excursion without splinting or tachypnea; breath sounds clear and equal bilaterally; no wheezes, no rhonchi, no rales, no hypoxia or respiratory distress, speaking full sentences ABD/GI: Normal bowel sounds; non-distended; soft, non-tender, no rebound, no guarding, no peritoneal signs, no hepatosplenomegaly, no tenderness at McBurney's point, negative Murphy sign BACK:  The back appears normal and is non-tender to palpation, there is no CVA tenderness EXT: Normal ROM in all joints; non-tender to palpation; no edema; normal capillary refill; no cyanosis, no calf tenderness or swelling    SKIN: Normal color for age and race; warm; no rash NEURO: Moves all extremities equally PSYCH: The patient's mood and manner are appropriate. Grooming and personal hygiene are appropriate.  MEDICAL DECISION MAKING: Patient here with likely viral gastroenteritis.  Her abdominal exam is benign.  She does have a leukocytosis but normal LFTs, lipase, creatinine.  We will treat symptomatically with IV fluids, Bentyl, Zofran, Imodium.  Doubt appendicitis, colitis, diverticulitis, bowel obstruction, cholecystitis, pancreatitis based on her very benign exam.  ED PROGRESS: Patient reports feeling "much better".  Her abdominal exam is still benign with no tenderness at McBurney's point.  Again suspect viral gastroenteritis.  She has been able to drink here without difficulty.  Will discharge with prescriptions of Zofran, Bentyl and Imodium.  Discussed bland diet for  the next several days.  Discussed return precautions.   At this time, I do not feel there is any life-threatening condition present. I have reviewed and discussed all results (EKG, imaging, lab, urine as appropriate) and exam findings with patient/family. I have reviewed nursing notes and appropriate  previous records.  I feel the patient is safe to be discharged home without further emergent workup and can continue workup as an outpatient as needed. Discussed usual and customary return precautions. Patient/family verbalize understanding and are comfortable with this plan.  Outpatient follow-up has been provided if needed. All questions have been answered.      Powell Halbert, Delice Bison, DO 09/25/17 [phone]

## 2018-01-16 ENCOUNTER — Other Ambulatory Visit: Payer: Self-pay

## 2018-02-20 DIAGNOSIS — L821 Other seborrheic keratosis: Secondary | ICD-10-CM | POA: Diagnosis not present

## 2018-02-20 DIAGNOSIS — B351 Tinea unguium: Secondary | ICD-10-CM | POA: Diagnosis not present

## 2018-03-21 DIAGNOSIS — E782 Mixed hyperlipidemia: Secondary | ICD-10-CM | POA: Diagnosis not present

## 2018-03-21 DIAGNOSIS — Z0000 Encounter for general adult medical examination without abnormal findings: Secondary | ICD-10-CM | POA: Diagnosis not present

## 2018-03-21 DIAGNOSIS — N951 Menopausal and female climacteric states: Secondary | ICD-10-CM | POA: Diagnosis not present

## 2018-04-23 ENCOUNTER — Encounter: Payer: Self-pay | Admitting: Women's Health

## 2018-04-23 ENCOUNTER — Ambulatory Visit: Payer: BLUE CROSS/BLUE SHIELD | Admitting: Women's Health

## 2018-04-23 VITALS — BP 120/82 | Ht 63.0 in | Wt 151.0 lb

## 2018-04-23 DIAGNOSIS — Z01419 Encounter for gynecological examination (general) (routine) without abnormal findings: Secondary | ICD-10-CM | POA: Diagnosis not present

## 2018-04-23 MED ORDER — ESTRADIOL 0.05 MG/24HR TD PTTW: 1.0000 | MEDICATED_PATCH | TRANSDERMAL | 4 refills | Status: DC

## 2018-04-23 NOTE — Progress Notes (Signed)
Robin Boyd 11/20/1955 [id number]    History:    Presents for annual exam.  TAH  with ovarian conservation with bladder suspension for fibroids done in [place].  Has been on Premarin 0.625 daily unable to tolerate decreasing dose has numerous hot flushes with poor sleep.  Negative colonoscopy at age 62, has scheduled follow-up this month.  Normal Pap and mammogram history, overdue for mammogram.   Primary care manages labs.  2017 normal DEXA.  Past medical history, past surgical history, family history and social history were all reviewed and documented in the EPIC chart.  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  ROS:  A ROS was performed and pertinent positives and negatives are included.  Exam:  Vitals:   04/23/18 1043  BP: 120/82  Weight: 151 lb (68.5 kg)  Height: 5\' 3"  (1.6 m)   Body mass index is 26.75 kg/m.   General appearance:  Normal Thyroid:  Symmetrical, normal in size, without palpable masses or nodularity. Respiratory  Auscultation:  Clear without wheezing or rhonchi Cardiovascular  Auscultation:  Regular rate, without rubs, murmurs or gallops  Edema/varicosities:  Not grossly evident Abdominal  Soft,nontender, without masses, guarding or rebound.  Liver/spleen:  No organomegaly noted  Hernia:  None appreciated  Skin  Inspection:  Grossly normal   Breasts: Examined lying and sitting. Bilateral silicone implants    Right: Without masses, retractions, discharge or axillary adenopathy.     Left: Without masses, retractions, discharge or axillary adenopathy. Gentitourinary   Inguinal/mons:  Normal without inguinal adenopathy  External genitalia:  Normal  BUS/Urethra/Skene's glands:  Normal  Vagina:  Normal  Cervix: And uterus absent  Adnexa/parametria:     Rt: Without masses or tenderness.   Lt: Without masses or tenderness.  Anus and perineum: Normal  Digital rectal exam: Normal sphincter tone without palpated masses  or tenderness  Assessment/Plan:  62 y.o. M HF G2, P2 for annual exam with complaint of continued hot flashes, unable to tolerate no estrogen.  TAH/fibroids on HRT Primary care manages labs  Plan: HRT reviewed risks of blood clots, strokes and breast cancer, will try  patch, Vivelle 0.05 patch twice weekly, gripping, proper use given and reviewed.  Instructed to call if continued problems with hot flushes.  SBE's, reviewed importance of annual screening mammogram, 3D tomography reviewed and encouraged instructed to schedule breast center.  Continue active lifestyle, encouraged weightbearing, balance type exercise.  Keep scheduled follow-up for screening colonoscopy.    [place], 11:49 AM 04/23/2018

## 2018-04-23 NOTE — Patient Instructions (Addendum)
shingrex  Vaccine for shingles  Mammogram  [phone]  Vit D 2000 iu daily    Health Maintenance for Postmenopausal Women Menopause is a normal process in which your reproductive ability comes to an end. This process happens gradually over a span of months to years, usually between the ages of 45 and 37. Menopause is complete when you have missed 12 consecutive menstrual periods. It is important to talk with your health care provider about some of the most common conditions that affect postmenopausal women, such as heart disease, cancer, and bone loss (osteoporosis). Adopting a healthy lifestyle and getting preventive care can help to promote your health and wellness. Those actions can also lower your chances of developing some of these common conditions. What should I know about menopause? During menopause, you may experience a number of symptoms, such as:  Moderate-to-severe hot flashes.  Night sweats.  Decrease in sex drive.  Mood swings.  Headaches.  Tiredness.  Irritability.  Memory problems.  Insomnia.  Choosing to treat or not to treat menopausal changes is an individual decision that you make with your health care provider. What should I know about hormone replacement therapy and supplements? Hormone therapy products are effective for treating symptoms that are associated with menopause, such as hot flashes and night sweats. Hormone replacement carries certain risks, especially as you become older. If you are thinking about using estrogen or estrogen with progestin treatments, discuss the benefits and risks with your health care provider. What should I know about heart disease and stroke? Heart disease, heart attack, and stroke become more likely as you age. This may be due, in part, to the hormonal changes that your body experiences during menopause. These can affect how your body processes dietary fats, triglycerides, and cholesterol. Heart attack and stroke are both  medical emergencies. There are many things that you can do to help prevent heart disease and stroke:  Have your blood pressure checked at least every 1-2 years. High blood pressure causes heart disease and increases the risk of stroke.  If you are 32-58 years old, ask your health care provider if you should take aspirin to prevent a heart attack or a stroke.  Do not use any tobacco products, including cigarettes, chewing tobacco, or electronic cigarettes. If you need help quitting, ask your health care provider.  It is important to eat a healthy diet and maintain a healthy weight. ? Be sure to include plenty of vegetables, fruits, low-fat dairy products, and lean protein. ? Avoid eating foods that are high in solid fats, added sugars, or salt (sodium).  Get regular exercise. This is one of the most important things that you can do for your health. ? Try to exercise for at least 150 minutes each week. The type of exercise that you do should increase your heart rate and make you sweat. This is known as moderate-intensity exercise. ? Try to do strengthening exercises at least twice each week. Do these in addition to the moderate-intensity exercise.  Know your numbers.Ask your health care provider to check your cholesterol and your blood glucose. Continue to have your blood tested as directed by your health care provider.  What should I know about cancer screening? There are several types of cancer. Take the following steps to reduce your risk and to catch any cancer development as early as possible. Breast Cancer  Practice breast self-awareness. ? This means understanding how your breasts normally appear and feel. ? It also means doing regular breast self-exams.  Let your health care provider know about any changes, no matter how small.  If you are 77 or older, have a clinician do a breast exam (clinical breast exam or CBE) every year. Depending on your age, family history, and medical  history, it may be recommended that you also have a yearly breast X-ray (mammogram).  If you have a family history of breast cancer, talk with your health care provider about genetic screening.  If you are at high risk for breast cancer, talk with your health care provider about having an MRI and a mammogram every year.  Breast cancer (BRCA) gene test is recommended for women who have family members with BRCA-related cancers. Results of the assessment will determine the need for genetic counseling and BRCA1 and for BRCA2 testing. BRCA-related cancers include these types: ? Breast. This occurs in males or females. ? Ovarian. ? Tubal. This may also be called fallopian tube cancer. ? Cancer of the abdominal or pelvic lining (peritoneal cancer). ? Prostate. ? Pancreatic.  Cervical, Uterine, and Ovarian Cancer Your health care provider may recommend that you be screened regularly for cancer of the pelvic organs. These include your ovaries, uterus, and vagina. This screening involves a pelvic exam, which includes checking for microscopic changes to the surface of your cervix (Pap test).  For women ages 21-65, health care providers may recommend a pelvic exam and a Pap test every three years. For women ages 47-65, they may recommend the Pap test and pelvic exam, combined with testing for human papilloma virus (HPV), every five years. Some types of HPV increase your risk of cervical cancer. Testing for HPV may also be done on women of any age who have unclear Pap test results.  Other health care providers may not recommend any screening for nonpregnant women who are considered low risk for pelvic cancer and have no symptoms. Ask your health care provider if a screening pelvic exam is right for you.  If you have had past treatment for cervical cancer or a condition that could lead to cancer, you need Pap tests and screening for cancer for at least 20 years after your treatment. If Pap tests have been  discontinued for you, your risk factors (such as having a new sexual partner) need to be reassessed to determine if you should start having screenings again. Some women have medical problems that increase the chance of getting cervical cancer. In these cases, your health care provider may recommend that you have screening and Pap tests more often.  If you have a family history of uterine cancer or ovarian cancer, talk with your health care provider about genetic screening.  If you have vaginal bleeding after reaching menopause, tell your health care provider.  There are currently no reliable tests available to screen for ovarian cancer.  Lung Cancer Lung cancer screening is recommended for adults 69-27 years old who are at high risk for lung cancer because of a history of smoking. A yearly low-dose CT scan of the lungs is recommended if you:  Currently smoke.  Have a history of at least 30 pack-years of smoking and you currently smoke or have quit within the past 15 years. A pack-year is smoking an average of one pack of cigarettes per day for one year.  Yearly screening should:  Continue until it has been 15 years since you quit.  Stop if you develop a health problem that would prevent you from having lung cancer treatment.  Colorectal Cancer  This type  of cancer can be detected and can often be prevented.  Routine colorectal cancer screening usually begins at age 3 and continues through age 34.  If you have risk factors for colon cancer, your health care provider may recommend that you be screened at an earlier age.  If you have a family history of colorectal cancer, talk with your health care provider about genetic screening.  Your health care provider may also recommend using home test kits to check for hidden blood in your stool.  A small camera at the end of a tube can be used to examine your colon directly (sigmoidoscopy or colonoscopy). This is done to check for the earliest  forms of colorectal cancer.  Direct examination of the colon should be repeated every 5-10 years until age 34. However, if early forms of precancerous polyps or small growths are found or if you have a family history or genetic risk for colorectal cancer, you may need to be screened more often.  Skin Cancer  Check your skin from head to toe regularly.  Monitor any moles. Be sure to tell your health care provider: ? About any new moles or changes in moles, especially if there is a change in a mole's shape or color. ? If you have a mole that is larger than the size of a pencil eraser.  If any of your family members has a history of skin cancer, especially at a Jamez Ambrocio age, talk with your health care provider about genetic screening.  Always use sunscreen. Apply sunscreen liberally and repeatedly throughout the day.  Whenever you are outside, protect yourself by wearing long sleeves, pants, a wide-brimmed hat, and sunglasses.  What should I know about osteoporosis? Osteoporosis is a condition in which bone destruction happens more quickly than new bone creation. After menopause, you may be at an increased risk for osteoporosis. To help prevent osteoporosis or the bone fractures that can happen because of osteoporosis, the following is recommended:  If you are 61-72 years old, get at least 1,000 mg of calcium and at least 600 mg of vitamin D per day.  If you are older than age 18 but younger than age 71, get at least 1,200 mg of calcium and at least 600 mg of vitamin D per day.  If you are older than age 65, get at least 1,200 mg of calcium and at least 800 mg of vitamin D per day.  Smoking and excessive alcohol intake increase the risk of osteoporosis. Eat foods that are rich in calcium and vitamin D, and do weight-bearing exercises several times each week as directed by your health care provider. What should I know about how menopause affects my mental health? Depression may occur at any  age, but it is more common as you become older. Common symptoms of depression include:  Low or sad mood.  Changes in sleep patterns.  Changes in appetite or eating patterns.  Feeling an overall lack of motivation or enjoyment of activities that you previously enjoyed.  Frequent crying spells.  Talk with your health care provider if you think that you are experiencing depression. What should I know about immunizations? It is important that you get and maintain your immunizations. These include:  Tetanus, diphtheria, and pertussis (Tdap) booster vaccine.  Influenza every year before the flu season begins.  Pneumonia vaccine.  Shingles vaccine.  Your health care provider may also recommend other immunizations. This information is not intended to replace advice given to you by your health  care provider. Make sure you discuss any questions you have with your health care provider. Document Released: 09/08/2005 Document Revised: 02/04/2016 Document Reviewed: 04/20/2015 Elsevier Interactive Patient Education  2018 [employer].

## 2018-04-29 DIAGNOSIS — R197 Diarrhea, unspecified: Secondary | ICD-10-CM | POA: Diagnosis not present

## 2018-05-03 ENCOUNTER — Telehealth: Payer: Self-pay | Admitting: *Deleted

## 2018-05-03 DIAGNOSIS — N6310 Unspecified lump in the right breast, unspecified quadrant: Secondary | ICD-10-CM

## 2018-05-03 NOTE — Telephone Encounter (Signed)
Appointment scheduled at breast center on 05/07/18 @ 9:50am. Patient informed.

## 2018-05-03 NOTE — Telephone Encounter (Signed)
-----   Message from Leron Croak, [place] sent at 05/03/2018  9:43 AM EDT ----- Regarding: FW: MAMMOGRAM ORDER Contact: [phone]   ----- Message ----- From: Huel Cote, NP Sent: 05/03/2018   9:03 AM EDT To: Leron Croak, CMA Subject: RE: MAMMOGRAM ORDER                            Ok pl put in the order for the diagnostic.  thanks ----- Message ----- From: Leron Croak, CMA Sent: 05/02/2018   4:59 PM EDT To: Huel Cote, NP Subject: MAMMOGRAM ORDER                                Evette Doffing I received a call from this patient saying that she had called the breast center to schedule a mammogram appointment but she was told she needs and order from you because she was supposed to have gone back last year for a diagnostic and never did. So now they are asking for an order for her to have a diagnostic mammogram rather than the screening.  Thanks  [place]

## 2018-05-07 ENCOUNTER — Other Ambulatory Visit: Payer: BLUE CROSS/BLUE SHIELD

## 2018-05-08 ENCOUNTER — Other Ambulatory Visit: Payer: Self-pay | Admitting: Women's Health

## 2018-05-08 ENCOUNTER — Ambulatory Visit
Admission: RE | Admit: 2018-05-08 | Discharge: 2018-05-08 | Disposition: A | Discharge status: Home / Self Care | Payer: BLUE CROSS/BLUE SHIELD | Source: Ambulatory Visit | Attending: Women's Health | Admitting: Women's Health

## 2018-05-08 DIAGNOSIS — N6001 Solitary cyst of right breast: Secondary | ICD-10-CM | POA: Diagnosis not present

## 2018-05-08 DIAGNOSIS — R921 Mammographic calcification found on diagnostic imaging of breast: Secondary | ICD-10-CM

## 2018-05-08 DIAGNOSIS — N6310 Unspecified lump in the right breast, unspecified quadrant: Secondary | ICD-10-CM

## 2018-05-16 ENCOUNTER — Ambulatory Visit
Admission: RE | Admit: 2018-05-16 | Discharge: 2018-05-16 | Disposition: A | Discharge status: Home / Self Care | Payer: BLUE CROSS/BLUE SHIELD | Source: Ambulatory Visit | Attending: Women's Health | Admitting: Women's Health

## 2018-05-16 ENCOUNTER — Other Ambulatory Visit: Payer: Self-pay | Admitting: Women's Health

## 2018-05-16 DIAGNOSIS — R921 Mammographic calcification found on diagnostic imaging of breast: Secondary | ICD-10-CM

## 2018-05-16 DIAGNOSIS — D0511 Intraductal carcinoma in situ of right breast: Secondary | ICD-10-CM | POA: Diagnosis not present

## 2018-05-17 ENCOUNTER — Telehealth: Payer: Self-pay | Admitting: Hematology

## 2018-05-17 NOTE — Telephone Encounter (Signed)
Spoke to patient to confirm afternoon [facility] appointment for 10/23, packet emailed to patient

## 2018-05-20 ENCOUNTER — Encounter: Payer: Self-pay | Admitting: *Deleted

## 2018-05-20 DIAGNOSIS — D0511 Intraductal carcinoma in situ of right breast: Secondary | ICD-10-CM

## 2018-05-21 ENCOUNTER — Encounter: Payer: Self-pay | Admitting: General Surgery

## 2018-05-21 NOTE — Progress Notes (Signed)
[place]   Telephone:([phone]) [phone] Fax:([phone]) [place] Note   Patient Care Team: Via, Lennette Bihari, MD as PCP - General (Family Medicine) Fanny Skates, MD as Consulting Physician (General Surgery) Truitt Merle, MD as Consulting Physician (Hematology) Kyung Rudd, MD as Consulting Physician (Radiation Oncology) 05/22/2018  Referring Provider: Huel Cote, NP  CHIEF COMPLAINTS/PURPOSE OF CONSULTATION:  Right breast DCIS  Oncology History   Cancer Staging Ductal carcinoma in situ (DCIS) of right breast Staging form: Breast, AJCC 8th Edition - Clinical stage from 05/16/2018: Stage 0 (cTis (DCIS), cN0, cM0, ER+, PR-, HER2: Not Assessed) - Signed by Truitt Merle, MD on 05/21/2018       Ductal carcinoma in situ (DCIS) of right breast   05/08/2018 Breast US    05/08/2018 Breast US IMPRESSION: 1. Interval resolution of small mass in the UPPER portion of the RIGHT breast for which follow-up was recommended in 2017. 2. Two new areas of calcifications are suspicious for malignancy and biopsy is indicated. The likely sonographic correlates have been identified to facilitate biopsy options, given the presence of implants. We discussed the possibility and low likelihood of implant rupture at the time of biopsy. 3. No adenopathy by ultrasound.    05/08/2018 Mammogram    05/08/2018 Diagnostic Mammogram IMPRESSION: 1. Interval resolution of small mass in the UPPER portion of the RIGHT breast for which follow-up was recommended in 2017. 2. Two new areas of calcifications are suspicious for malignancy and biopsy is indicated. The likely sonographic correlates have been identified to facilitate biopsy options, given the presence of implants. We discussed the possibility and low likelihood of implant rupture at the time of biopsy. 3. No adenopathy by ultrasound.    05/16/2018 Cancer Staging    Staging form: Breast, AJCC 8th Edition - Clinical stage from  05/16/2018: Stage 0 (cTis (DCIS), cN0, cM0, ER+, PR-, HER2: Not Assessed) - Signed by Truitt Merle, MD on 05/21/2018    05/16/2018 Receptors her2    05/16/2018 Surgical Pathology ADDITIONAL INFORMATION: 1. PROGNOSTIC INDICATORS Results: IMMUNOHISTOCHEMICAL AND MORPHOMETRIC ANALYSIS PERFORMED MANUALLY Estrogen Receptor: 50%, POSITIVE, STRONG STAINING INTENSITY Progesterone Receptor: 0%, NEGATIVE    05/16/2018 Pathology Results    Diagnosis 1. 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 2. Breast, right, needle core biopsy, 12:30 o'clock, 4cm from nipple - FOCAL HIGH GRADE DUCTAL CARCINOMA IN SITU.    05/20/2018 Initial Diagnosis    Ductal carcinoma in situ (DCIS) of right breast      HISTORY OF PRESENTING ILLNESS: ** Robin Boyd 62 y.o. female is here because of newly diagnosed right breast DCIS.  She is noted to be on HRT since 2018 for severe hot flashes. She was noted to have a diagnostic mammogram in 2017 that revealed a likely benign right breast cyst at the 10 o'clock position and was advised to follow-up in 6 months, but she didn't. Her follow-up mammogram in 04/2018 revealed 2 new likely malignant right breast calcifications, and pathology confirmed DCIS. Today, she is here with her husband. She stopped Premarin 2 days ago. She denies feeling a lump, nipple discharge, or skin changes. She started having hot flashes 2 years after TAH.   She denies other medical problems. She previously had a tonsillectomy.   GYN HISTORY  Menarchal: 62 YO LMP: 10/06/2010, at almost 62YO Contraceptive: TAH  HRT: yes (used Premarin) since 10/2016, for intolerable hot flashes. She states that  she used HRT for 10 years. GP: G2 P2, 62 YO at first live birth, she breastfed   MEDICAL HISTORY:  Past Medical History:  Diagnosis Date  . Hyperlipidemia     SURGICAL HISTORY: Past Surgical History:    Procedure Laterality Date  . ABDOMINAL HYSTERECTOMY  2012   ABDOMINAL HYSTERECTOMY WITH OVARIAN PRESERVATION - [place]   . AUGMENTATION MAMMAPLASTY  6962,    SILICONE  . BREAST SURGERY    . TUBAL LIGATION      SOCIAL HISTORY: Social History   Socioeconomic History  . Marital status: Married    Spouse name: Not on file  . Number of children: Not on file  . Years of education: Not on file  . Highest education level: Not on file  Occupational History  . Not on file  Social Needs  . Financial resource strain: Not on file  . Food insecurity:    Worry: Not on file    Inability: Not on file  . Transportation needs:    Medical: Not on file    Non-medical: Not on file  Tobacco Use  . Smoking status: Never Smoker  . Smokeless tobacco: Never Used  Substance and Sexual Activity  . Alcohol use: Not Currently    Alcohol/week: 0.0 standard drinks  . Drug use: No  . Sexual activity: Yes    Comment: 1ST INTERCOURSE- 24, PARTNERS- 1  Lifestyle  . Physical activity:    Days per week: Not on file    Minutes per session: Not on file  . Stress: Not on file  Relationships  . Social connections:    Talks on phone: Not on file    Gets together: Not on file    Attends religious service: Not on file    Active member of club or organization: Not on file    Attends meetings of clubs or organizations: Not on file    Relationship status: Not on file  . Intimate partner violence:    Fear of current or ex partner: Not on file    Emotionally abused: Not on file    Physically abused: Not on file    Forced sexual activity: Not on file  Other Topics Concern  . Not on file  Social History Narrative  . Not on file    FAMILY HISTORY: History reviewed. No pertinent family history.  ALLERGIES:  has No Known Allergies.  MEDICATIONS:  Current Outpatient Medications  Medication Sig Dispense Refill  . estrogens, conjugated, (PREMARIN) 0.625 MG tablet Take 0.625 mg by mouth daily. Take  daily for 21 days then do not take for 7 days.    Marland Kitchen atorvastatin (LIPITOR) 40 MG tablet     . terbinafine (LAMISIL) 250 MG tablet     . venlafaxine XR (EFFEXOR-XR) 75 MG 24 hr capsule Take 1 capsule (75 mg total) by mouth daily with breakfast. 30 capsule 2   No current facility-administered medications for this visit.     REVIEW OF SYSTEMS:   Constitutional: Denies fevers, chills or abnormal night sweats (+) hot flashes Eyes: Denies blurriness of vision, double vision or watery eyes Ears, nose, mouth, throat, and face: Denies mucositis or sore throat Respiratory: Denies cough, dyspnea or wheezes Cardiovascular: Denies palpitation, chest discomfort or lower extremity swelling Gastrointestinal:  Denies nausea, heartburn or change in bowel habits Skin: Denies abnormal skin rashes Lymphatics: Denies new lymphadenopathy or easy bruising Neurological:Denies numbness, tingling or new weaknesses Behavioral/Psych: Mood is stable, no new changes  Breast: All other systems  were reviewed with the patient and are negative.  PHYSICAL EXAMINATION:  ECOG PERFORMANCE STATUS: 0 - Asymptomatic  Vitals:   05/22/18 1306  BP: 140/79  Pulse: 81  Resp: 18  Temp: 98.2 F (36.8 C)  SpO2: 100%   Filed Weights   05/22/18 1306  Weight: 151 lb 12.8 oz (68.9 kg)    GENERAL:alert, no distress and comfortable SKIN: skin color, texture, turgor are normal, no rashes or significant lesions EYES: normal, conjunctiva are pink and non-injected, sclera clear OROPHARYNX:no exudate, no erythema and lips, buccal mucosa, and tongue normal  NECK: supple, thyroid normal size, non-tender, without nodularity LYMPH:  no palpable lymphadenopathy in the cervical, axillary or inguinal LUNGS: clear to auscultation and percussion with normal breathing effort HEART: regular rate & rhythm and no murmurs and no lower extremity edema ABDOMEN:abdomen soft, non-tender and normal bowel sounds Musculoskeletal:no cyanosis of  digits and no clubbing  PSYCH: alert & oriented x 3 with fluent speech NEURO: no focal motor/sensory deficits Breast: No palpable masses, skin changes or nipple inversion or discharge.  LABORATORY DATA:  I have reviewed the data as listed CBC Latest Ref Rng & Units 05/22/2018 09/25/2017 12/22/2015  WBC 4.0 - 10.5 K/uL 6.1 19.8(H) 4.8  Hemoglobin 12.0 - 15.0 g/dL 13.4 14.6 13.2  Hematocrit 36.0 - 46.0 % 42.1 46.0 40.6  Platelets 150 - 400 K/uL 253 427(H) 254    CMP Latest Ref Rng & Units 05/22/2018 09/25/2017 07/10/2016  Glucose 70 - 99 mg/dL 88 153(H) -  BUN 6 - 20 mg/dL 11 23(H) -  Creatinine 0.44 - 1.00 mg/dL 0.94 0.92 -  Sodium 135 - 145 mmol/L 143 139 -  Potassium 3.5 - 5.1 mmol/L 4.1 4.7 -  Chloride 98 - 111 mmol/L 106 104 -  CO2 22 - 32 mmol/L 25 24 -  Calcium 8.9 - 10.3 mg/dL 9.8 8.5(L) -  Total Protein 6.5 - 8.1 g/dL 7.8 7.0 -  Total Bilirubin 0.3 - 1.2 mg/dL 0.5 0.4 -  Alkaline Phos 38 - 126 U/L 48 84 -  AST 15 - 41 U/L '19 22 19  ' ALT 0 - 44 U/L '16 27 17    ' PATHOLOGY:   05/16/2018 Surgical Pathology ADDITIONAL INFORMATION: 1. PROGNOSTIC INDICATORS Results: IMMUNOHISTOCHEMICAL AND MORPHOMETRIC ANALYSIS PERFORMED MANUALLY Estrogen Receptor: 50%, POSITIVE, STRONG STAINING INTENSITY Progesterone Receptor: 0%, NEGATIVE COMMENT: The negative hormone receptor study(ies) in this case has An internal positive control. REFERENCE RANGE ESTROGEN RECEPTOR NEGATIVE 0% POSITIVE =>1% REFERENCE RANGE PROGESTERONE RECEPTOR NEGATIVE 0% POSITIVE =>1% All controls stained appropriately 2. THERE IS INSUFFICIENT TUMOR PRESENT FOR ESTROGEN AND PROGESTERONE RECEPTOR STUDIES.. Diagnosis 1. 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 2. Breast, right, needle core biopsy, 12:30 o'clock, 4cm from nipple - FOCAL HIGH GRADE DUCTAL CARCINOMA IN SITU. Microscopic Comment 1. and 2. Estrogen and  progesterone will be performed. Dr. Jeannie Done agrees. Called to [facility] on 05/17/18. (JDP:gt, 05/17/18) Claudette Laws MD Pathologist, Electronic Signature (Case signed 05/17/2018) Specimen Gross and Clinical Information Specimen Comment 1. TIF: 2:15 PM; extracted less than 1 minute; suspicious calcifications at 2 sites in the upper right breast, both at the 12:30 o'clock axis, 6cm from nipple and 4cm from nipple respectively 2. TIF: 2:30 PM; extracted less than 1 minute Specimen(s) Obtained: 1. Breast, right, needle core biopsy, 12:30 o'clock 6cm from nipple 2. Breast, right, needle core biopsy, 12:30 o'clock, 4cm from nipple Specimen Clinical  Information 1. DCIS vs FCC vs FA, favor DCIS 2. DCIS vs FCC vs FA, favor DCIS Gross 1. Received in formalin labeled with the patient's name and "Right breast 12:30 6 cm FN", are 4 core(s) of tan-yellow, fibroadipose tissue, ranging from 0.7 x 0.2 x 0.2 cm to 2.0 x 0.2 x 0.2 cm. The specimen is entirely submitted in 1 cassette(s). Time in formalin 2:15 pm on 05/16/18. Cold ischemic time less than 1 min. Craig Staggers 05/16/18 ) 2. Received in formalin labeled with the patient's name and "Right breast 12:30 4 cm FN", are 6 core(s) and core fragments of tan-yellow, fibroadipose tissue, ranging from 0.2 x 0.1 x 0.1 cm to 2.1 x 0.2 x 0.2 cm. The specimen is entirely submitted in 1 cassette(s). Time in formalin 2:30 pm on 05/16/18. Cold ischemic time less than 1 min. Craig Staggers 05/16/18 ) Stain(s) used in Diagnosis: The following stain(s) were used in diagnosing the case: PR-ACIS, ER-ACIS. The control(s) stained appropriately.  RADIOGRAPHIC STUDIES: I have personally reviewed the radiological images as listed and agreed with the findings in the report.  05/08/2018 Breast US IMPRESSION: 1. Interval resolution of small mass in the UPPER portion of the RIGHT breast for which follow-up was recommended in 2017. 2. Two new areas of calcifications are  suspicious for malignancy and biopsy is indicated. The likely sonographic correlates have been identified to facilitate biopsy options, given the presence of implants. We discussed the possibility and low likelihood of implant rupture at the time of biopsy. 3. No adenopathy by ultrasound.  05/08/2018 Diagnostic Mammogram IMPRESSION: 1. Interval resolution of small mass in the UPPER portion of the RIGHT breast for which follow-up was recommended in 2017. 2. Two new areas of calcifications are suspicious for malignancy and biopsy is indicated. The likely sonographic correlates have been identified to facilitate biopsy options, given the presence of implants. We discussed the possibility and low likelihood of implant rupture at the time of biopsy. 3. No adenopathy by ultrasound.  US Breast Ltd Uni Right Inc Axilla  Result Date: 05/08/2018 CLINICAL DATA:  Delayed followup for probably benign mass in the RIGHT breast. It was recommended that the patient return in 6 months after her diagnostic evaluation in December 2017. This is the patient's first exam since that evaluation. EXAM: DIGITAL DIAGNOSTIC BILATERAL MAMMOGRAM WITH IMPLANTS, CAD AND TOMO ULTRASOUND RIGHT BREAST The patient has retropectoral silicone implants. Standard and implant displaced views were performed. COMPARISON:  07/03/2016 ACR Breast Density Category b: There are scattered areas of fibroglandular density. FINDINGS: In the area of previous concern in the UPPER portion of the RIGHT breast, a small mass has resolved. There are 2 new groups of calcifications in the superior aspect of the RIGHT breast, close to the implant. On additional magnified views, the smaller is a 0.4 centimeter group of fine pleomorphic calcifications associated with a parenchymal density within the UPPER central portion of the RIGHT breast. The second is 0.8 x 0.7 centimeter group of calcifications, also within the UPPER central portion of the breast. The LEFT  breast is negative. On physical exam, I palpate no abnormality in the UPPER central portion of the RIGHT breast. Targeted ultrasound is performed, showing a hypoechoic mass with internal calcifications in the 12:30 o'clock location of the RIGHT breast 4 centimeters from the nipple. There is associated internal blood flow and mild posterior acoustic shadowing. Mass is irregular in shape, and margins are indistinct. The mass measures 0.4 x 0.5 x 0.4 centimeters. In the 12:30 o'clock location 6  centimeters from nipple, there is irregular mixed echogenicity region in the breast associated with neither posterior acoustic enhancement or shadowing. In this region there are small foci consistent with calcifications. Although somewhat indistinct, it is estimated to measure 0.9 x 0.7 x 0.9 centimeters. Measuring OUTER edge to OUTER edge, these 2 masses are 3.3 centimeters apart. Evaluation of the axilla is negative for adenopathy. Mammographic images were processed with CAD. IMPRESSION: 1. Interval resolution of small mass in the UPPER portion of the RIGHT breast for which follow-up was recommended in 2017. 2. Two new areas of calcifications are suspicious for malignancy and biopsy is indicated. The likely sonographic correlates have been identified to facilitate biopsy options, given the presence of implants. We discussed the possibility and low likelihood of implant rupture at the time of biopsy. 3. No adenopathy by ultrasound. RECOMMENDATION: Ultrasound-guided core biopsy is recommended of 2 sites in the 12:30 o'clock location of the RIGHT breast. I have discussed the findings and recommendations with the patient. Results were also provided in writing at the conclusion of the visit. If applicable, a reminder letter will be sent to the patient regarding the next appointment. BI-RADS CATEGORY  4: Suspicious. Electronically Signed   By: Nolon Nations M.D.   On: 05/08/2018 16:59   Mm Diag Breast W/implant Tomo  Bilateral  Result Date: 05/08/2018 CLINICAL DATA:  Delayed followup for probably benign mass in the RIGHT breast. It was recommended that the patient return in 6 months after her diagnostic evaluation in December 2017. This is the patient's first exam since that evaluation. EXAM: DIGITAL DIAGNOSTIC BILATERAL MAMMOGRAM WITH IMPLANTS, CAD AND TOMO ULTRASOUND RIGHT BREAST The patient has retropectoral silicone implants. Standard and implant displaced views were performed. COMPARISON:  07/03/2016 ACR Breast Density Category b: There are scattered areas of fibroglandular density. FINDINGS: In the area of previous concern in the UPPER portion of the RIGHT breast, a small mass has resolved. There are 2 new groups of calcifications in the superior aspect of the RIGHT breast, close to the implant. On additional magnified views, the smaller is a 0.4 centimeter group of fine pleomorphic calcifications associated with a parenchymal density within the UPPER central portion of the RIGHT breast. The second is 0.8 x 0.7 centimeter group of calcifications, also within the UPPER central portion of the breast. The LEFT breast is negative. On physical exam, I palpate no abnormality in the UPPER central portion of the RIGHT breast. Targeted ultrasound is performed, showing a hypoechoic mass with internal calcifications in the 12:30 o'clock location of the RIGHT breast 4 centimeters from the nipple. There is associated internal blood flow and mild posterior acoustic shadowing. Mass is irregular in shape, and margins are indistinct. The mass measures 0.4 x 0.5 x 0.4 centimeters. In the 12:30 o'clock location 6 centimeters from nipple, there is irregular mixed echogenicity region in the breast associated with neither posterior acoustic enhancement or shadowing. In this region there are small foci consistent with calcifications. Although somewhat indistinct, it is estimated to measure 0.9 x 0.7 x 0.9 centimeters. Measuring OUTER edge to  OUTER edge, these 2 masses are 3.3 centimeters apart. Evaluation of the axilla is negative for adenopathy. Mammographic images were processed with CAD. IMPRESSION: 1. Interval resolution of small mass in the UPPER portion of the RIGHT breast for which follow-up was recommended in 2017. 2. Two new areas of calcifications are suspicious for malignancy and biopsy is indicated. The likely sonographic correlates have been identified to facilitate biopsy options, given the presence  of implants. We discussed the possibility and low likelihood of implant rupture at the time of biopsy. 3. No adenopathy by ultrasound. RECOMMENDATION: Ultrasound-guided core biopsy is recommended of 2 sites in the 12:30 o'clock location of the RIGHT breast. I have discussed the findings and recommendations with the patient. Results were also provided in writing at the conclusion of the visit. If applicable, a reminder letter will be sent to the patient regarding the next appointment. BI-RADS CATEGORY  4: Suspicious. Electronically Signed   By: Nolon Nations M.D.   On: 05/08/2018 16:59   Mm Clip Placement Right  Result Date: 05/16/2018 CLINICAL DATA:  Status post ultrasound-guided biopsies for 2 sites of suspicious calcifications in the upper RIGHT breast EXAM: DIAGNOSTIC RIGHT MAMMOGRAM POST STEREOTACTIC BIOPSY x2 COMPARISON:  Previous exam(s). FINDINGS: Mammographic images were obtained following ultrasound guided biopsy of 2 sites of suspicious calcifications within the upper RIGHT breast. Ribbon shaped clip is well-positioned at the site of the targeted calcifications within the upper RIGHT breast (12:30 o'clock axis, 6 cm from nipple). Coil shaped clip is well-positioned at the site of the targeted calcifications within the upper RIGHT breast (12:30 o'clock axis, 4 cm from nipple). X-ray of both specimen containers shows calcifications within both samples. IMPRESSION: 1. Ribbon shaped biopsy clip is well-positioned at the site of  the targeted calcifications within the RIGHT breast at the 12:30 o'clock axis, 6 cm from nipple. 2. Coil shaped biopsy clip is well-positioned at the site of the targeted calcifications within the RIGHT breast at the 12:30 o'clock axis, 4 cm from nipple. Final Assessment: Post Procedure Mammograms for Marker Placement Electronically Signed   By: Franki Cabot M.D.   On: 05/16/2018 16:25   [place] Rt Breast Bx W Loc Dev 1st Lesion Img Bx Spec US Guide  Addendum Date: 05/17/2018   ADDENDUM REPORT: 05/17/2018 14:35 ADDENDUM: Pathology revealed HIGH GRADE DUCTAL CARCINOMA IN SITU WITH NECROSIS AND CALCIFICATIONS of the Right breast, 12:30 o'clock 6 cm from nipple. FOCAL HIGH GRADE DUCTAL CARCINOMA IN SITU of the Right breast, 12:30 o'clock, 4 cm from nipple. This was found to be concordant by Dr. Franki Cabot. Pathology results were discussed with the patient and her husband by telephone. The patient reported doing well after the biopsies with tenderness at the sites. Post biopsy instructions and care were reviewed and questions were answered. The patient was encouraged to call [facility] for any additional concerns. The patient was referred to [facility] on May 22, 2018. Recommendation for a bilateral breast MRI for further evaluation of extent of disease. Pathology results reported by Terie Purser, RN on 05/17/2018. Electronically Signed   By: Franki Cabot M.D.   On: 05/17/2018 14:35   Result Date: 05/17/2018 CLINICAL DATA:  Patient with 2 sites of suspicious calcifications within the upper RIGHT breast, both at 12:30 o'clock axis, with sonographic correlate for each site identified on recent diagnostic ultrasound, presents today for ultrasound-guided core biopsies for both sites. EXAM: ULTRASOUND GUIDED RIGHT BREAST CORE NEEDLE BIOPSY x2 COMPARISON:  Previous exam(s). PROCEDURE: I met with the patient and  we discussed the procedure of ultrasound-guided biopsy, including benefits and alternatives. We discussed the high likelihood of a successful procedure. We discussed the risks of the procedure including infection, bleeding, tissue injury, clip migration, and inadequate sampling. Informed written consent was given. The usual time-out protocol was performed immediately prior to the procedure. Site 1: Lesion quadrant:  Upper inner quadrant Using sterile technique and 1% Lidocaine as local anesthetic, under direct ultrasound visualization, a 12 gauge spring-loaded device was used to perform biopsy of the calcifications in the RIGHT breast at the 12:30 o'clock axis, 6 cm from nipple,using a lateral approach. At the conclusion of the procedure, a ribbon shaped tissue marker was deployed into the biopsy cavity. Site 2: Lesion quadrant: Upper inner quadrant Using sterile technique and 1% Lidocaine as local anesthetic, under direct ultrasound visualization, a 12 gauge spring-loaded device was used to perform biopsy of the calcifications in the RIGHT breast at the 12:30 o'clock axis, 4 cm from nipple,using a lateral approach. At the conclusion of the procedure, a coil shaped tissue marker clip was deployed into the biopsy cavity. Follow-up 2-view mammogram was performed and dictated separately. IMPRESSION: 1. Ultrasound-guided biopsy of the RIGHT breast calcifications at the 12:30 o'clock axis, 6 cm from the nipple. Ribbon shaped clip placed at the biopsy site. 2. Ultrasound-guided biopsy of the RIGHT breast calcifications at the 12:30 o'clock axis, 4 cm from the nipple. Coil shaped clip placed at the biopsy site. Electronically Signed: By: Franki Cabot M.D. On: 05/16/2018 14:52   [place] Rt Breast Bx W Loc Dev Ea Add Lesion Img Bx Spec US Guide  Addendum Date: 05/17/2018   ADDENDUM REPORT: 05/17/2018 14:35 ADDENDUM: Pathology revealed HIGH GRADE DUCTAL CARCINOMA IN SITU WITH NECROSIS AND CALCIFICATIONS of the Right breast,  12:30 o'clock 6 cm from nipple. FOCAL HIGH GRADE DUCTAL CARCINOMA IN SITU of the Right breast, 12:30 o'clock, 4 cm from nipple. This was found to be concordant by Dr. Franki Cabot. Pathology results were discussed with the patient and her husband by telephone. The patient reported doing well after the biopsies with tenderness at the sites. Post biopsy instructions and care were reviewed and questions were answered. The patient was encouraged to call [facility] for any additional concerns. The patient was referred to [facility] on May 22, 2018. Recommendation for a bilateral breast MRI for further evaluation of extent of disease. Pathology results reported by Terie Purser, RN on 05/17/2018. Electronically Signed   By: Franki Cabot M.D.   On: 05/17/2018 14:35   Result Date: 05/17/2018 CLINICAL DATA:  Patient with 2 sites of suspicious calcifications within the upper RIGHT breast, both at 12:30 o'clock axis, with sonographic correlate for each site identified on recent diagnostic ultrasound, presents today for ultrasound-guided core biopsies for both sites. EXAM: ULTRASOUND GUIDED RIGHT BREAST CORE NEEDLE BIOPSY x2 COMPARISON:  Previous exam(s). PROCEDURE: I met with the patient and we discussed the procedure of ultrasound-guided biopsy, including benefits and alternatives. We discussed the high likelihood of a successful procedure. We discussed the risks of the procedure including infection, bleeding, tissue injury, clip migration, and inadequate sampling. Informed written consent was given. The usual time-out protocol was performed immediately prior to the procedure. Site 1: Lesion quadrant: Upper inner quadrant Using sterile technique and 1% Lidocaine as local anesthetic, under direct ultrasound visualization, a 12 gauge spring-loaded device was used to perform biopsy of the calcifications in the RIGHT  breast at the 12:30 o'clock axis, 6 cm from nipple,using a lateral approach. At the conclusion of the procedure, a ribbon shaped tissue marker was deployed into the biopsy cavity. Site 2: Lesion quadrant: Upper inner quadrant Using sterile technique and 1% Lidocaine as local anesthetic, under direct ultrasound visualization, a 12 gauge spring-loaded device was used to  perform biopsy of the calcifications in the RIGHT breast at the 12:30 o'clock axis, 4 cm from nipple,using a lateral approach. At the conclusion of the procedure, a coil shaped tissue marker clip was deployed into the biopsy cavity. Follow-up 2-view mammogram was performed and dictated separately. IMPRESSION: 1. Ultrasound-guided biopsy of the RIGHT breast calcifications at the 12:30 o'clock axis, 6 cm from the nipple. Ribbon shaped clip placed at the biopsy site. 2. Ultrasound-guided biopsy of the RIGHT breast calcifications at the 12:30 o'clock axis, 4 cm from the nipple. Coil shaped clip placed at the biopsy site. Electronically Signed: By: Franki Cabot M.D. On: 05/16/2018 14:52    ASSESSMENT & PLAN:  Robin Boyd is a 62 y.o. lovely female with a history of TAH  with ovarian conservation with bladder suspension for fibroids done in [place], and HRT due to intolerable hot flahses   1. 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 -She is a candidate for breast conservation surgery. She has been seen by breast surgeon Dr. Dalbert Batman, who recommends lumpectomy. -Her DCIS will be cured by complete surgical resection. Any form of adjuvant therapy is preventive. -Given her strongly positive disesae, I recommend her to consider antiestrogen therapy with Tamoxifen or AIs, which decrease her risk of future breast cancer by ~40%.  -She will likely benefit from breast radiation if she undergo lumpectomy to decrease the risk of breast cancer.  She saw Dr. Lisbeth Renshaw and discussed radiation therapy. -I used the DCIS normagram from [facility]. Her estimated risk of descent has no next 10 years is 15%, which will reduce to 6 to 7% by radiation or antiestrogen therapy, and 3% if she takes both radiation and endocrine therapy.  She is interested in adjuvant therapy. -We also discussed that biopsy may have sampling limitation, we will review her surgical path, to see if she has any invasive carcinoma components. -We discussed breast cancer surveillance after she completes treatment, Including annual mammogram, breast exam every 6-12 months. -I discussed stopping HRT and starting Effexor for hot flashes. She agrees.  Potential side effects of Effexor discussed with her -Labs are WNLs -f/u after radiation   Plan -She will likely have lumpectomy soon -I'll see her at the end of radiation to review her pathology result and finalize her antiestrogen therapy.   -I prescribed Effexor for hot flashes   No orders of the defined types were placed in this encounter.   All questions were answered. The patient knows to call the clinic with any problems, questions or concerns. I spent 35 minutes counseling the patient face to face. The total time spent in the appointment was 45 minutes and more than 50% was on counseling.     Truitt Merle, MD 05/22/2018 2:33 PM  I, Carmon Sails am acting as scribe for Dr. Truitt Merle.  I have reviewed the above documentation for accuracy and completeness, and I agree with the above.

## 2018-05-22 ENCOUNTER — Inpatient Hospital Stay: Payer: BLUE CROSS/BLUE SHIELD | Attending: Hematology | Admitting: Hematology

## 2018-05-22 ENCOUNTER — Encounter: Payer: Self-pay | Admitting: Hematology

## 2018-05-22 ENCOUNTER — Other Ambulatory Visit: Payer: Self-pay | Admitting: General Surgery

## 2018-05-22 ENCOUNTER — Ambulatory Visit: Payer: BLUE CROSS/BLUE SHIELD | Admitting: Physical Therapy

## 2018-05-22 ENCOUNTER — Ambulatory Visit
Admission: RE | Admit: 2018-05-22 | Discharge: 2018-05-22 | Disposition: A | Discharge status: Home / Self Care | Payer: BLUE CROSS/BLUE SHIELD | Source: Ambulatory Visit | Attending: Radiation Oncology | Admitting: Radiation Oncology

## 2018-05-22 ENCOUNTER — Inpatient Hospital Stay: Payer: BLUE CROSS/BLUE SHIELD

## 2018-05-22 VITALS — BP 140/79 | HR 81 | Temp 98.2°F | Resp 18 | Ht 63.0 in | Wt 151.8 lb

## 2018-05-22 DIAGNOSIS — D0511 Intraductal carcinoma in situ of right breast: Secondary | ICD-10-CM

## 2018-05-22 DIAGNOSIS — Z170 Estrogen receptor positive status [ER+]: Secondary | ICD-10-CM | POA: Diagnosis not present

## 2018-05-22 DIAGNOSIS — C50211 Malignant neoplasm of upper-inner quadrant of right female breast: Secondary | ICD-10-CM | POA: Diagnosis not present

## 2018-05-22 DIAGNOSIS — Z90710 Acquired absence of both cervix and uterus: Secondary | ICD-10-CM | POA: Diagnosis not present

## 2018-05-22 DIAGNOSIS — C50811 Malignant neoplasm of overlapping sites of right female breast: Secondary | ICD-10-CM

## 2018-05-22 DIAGNOSIS — Z9882 Breast implant status: Secondary | ICD-10-CM | POA: Diagnosis not present

## 2018-05-22 LAB — CMP (CANCER CENTER ONLY)
ALT: 16 U/L (ref 0–44)
ANION GAP: 12 (ref 5–15)
AST: 19 U/L (ref 15–41)
Albumin: 4.3 g/dL (ref 3.5–5.0)
Alkaline Phosphatase: 48 U/L (ref 38–126)
BUN: 11 mg/dL (ref 6–20)
CO2: 25 mmol/L (ref 22–32)
Calcium: 9.8 mg/dL (ref 8.9–10.3)
Chloride: 106 mmol/L (ref 98–111)
Creatinine: 0.94 mg/dL (ref 0.44–1.00)
GFR, Estimated: >60 mL/min (ref >60)
Glucose, Bld: 88 mg/dL (ref 70–99)
POTASSIUM: 4.1 mmol/L (ref 3.5–5.1)
SODIUM: 143 mmol/L (ref 135–145)
TOTAL PROTEIN: 7.8 g/dL (ref 6.5–8.1)
Total Bilirubin: 0.5 mg/dL (ref 0.3–1.2)

## 2018-05-22 LAB — CBC WITH DIFFERENTIAL (CANCER CENTER ONLY)
Abs Immature Granulocytes: 0.02 10*3/uL (ref 0.00–0.07)
BASOS ABS: 0.1 10*3/uL (ref 0.0–0.1)
BASOS PCT: 1 %
EOS ABS: 0.1 10*3/uL (ref 0.0–0.5)
Eosinophils Relative: 1 %
HEMATOCRIT: 42.1 % (ref 36.0–46.0)
Hemoglobin: 13.4 g/dL (ref 12.0–15.0)
IMMATURE GRANULOCYTES: 0 %
Lymphocytes Relative: 50 %
Lymphs Abs: 3 10*3/uL (ref 0.7–4.0)
MCH: 28.1 pg (ref 26.0–34.0)
MCHC: 31.8 g/dL (ref 30.0–36.0)
MCV: 88.3 fL (ref 80.0–100.0)
Monocytes Absolute: 0.5 10*3/uL (ref 0.1–1.0)
Monocytes Relative: 9 %
NEUTROS PCT: 39 %
NRBC: 0 % (ref 0.0–0.2)
Neutro Abs: 2.4 10*3/uL (ref 1.7–7.7)
Platelet Count: 253 10*3/uL (ref 150–400)
RBC: 4.77 MIL/uL (ref 3.87–5.11)
RDW: 14 % (ref 11.5–15.5)
WBC Count: 6.1 10*3/uL (ref 4.0–10.5)

## 2018-05-22 MED ORDER — VENLAFAXINE HCL ER 75 MG PO CP24: 75.0000 mg | ORAL_CAPSULE | Freq: Every day | ORAL | 2 refills | Status: DC

## 2018-05-22 NOTE — Progress Notes (Signed)
[place] Psychosocial Distress Screening Spiritual Care  Met with Kadeja in [facility] to introduce [place] team/resources, reviewing distress screen per protocol.  The patient scored a 3 on the Psychosocial Distress Thermometer which indicates mild distress. Also assessed for distress and other psychosocial needs.   ON[facility]N DISTRESS SCREENING 05/22/2018  Screening Type Initial Screening  Distress experienced in past week (1-10) 3  Information Concerns Type Lack of info about diagnosis;Lack of info about treatment;Lack of info about complementary therapy choices  Physical Problem type Constipation/diarrhea  Referral to support programs Yes   The patient presented to [facility] with her husband. The patient expressed that her distress remains at a 3 due to anxiety surrounding treatment not being behind her yet, but the patient reported that she feels some relief from receiving information about her diagnosis and treatment today. The patient reported that she feels well supported by her treatment team. The patient expressed that she is not currently interested in any [facility] resources, but she will reach out if any needs arise.  Follow up needed: No.  Doris Cheadle, Counseling Intern [phone]

## 2018-05-22 NOTE — Progress Notes (Signed)
Radiation Oncology         [phone] ________________________________  Name: Robin Boyd        MRN: [id number]  Date of Service: 05/22/2018 DOB: September 27, 1955  CC:Via, Lennette Bihari, MD  Fanny Skates, MD     REFERRING PHYSICIAN: Fanny Skates, MD   DIAGNOSIS: The encounter diagnosis was Ductal carcinoma in situ (DCIS) of right breast.   HISTORY OF PRESENT ILLNESS: Robin Boyd is a 62 y.o. female seen in the multidisciplinary breast clinic for a new diagnosis of right breast cancer. The patient was noted to have 2 areas of calcification sin the right breast that led to additional diagnostic imaging which revealed 2 masses with calcifications in the superior aspect of the right breast. At 12:30 there was a 9 x 9 x 7 mm mass as well as a 5 x 4 x 4 mm area also at 12:30. She had an axillary ultrasound that was negative for adenopathy. She underwent a biopsy of both sites on 05/16/18 that revealed a high grade DCIS with calcifications, ER positive, PR negative. She comes today to discuss treatment recommendations for her cancer.    PREVIOUS RADIATION THERAPY: No   PAST MEDICAL HISTORY:  Past Medical History:  Diagnosis Date  . Hyperlipidemia        PAST SURGICAL HISTORY: Past Surgical History:  Procedure Laterality Date  . ABDOMINAL HYSTERECTOMY  2012   ABDOMINAL HYSTERECTOMY WITH OVARIAN PRESERVATION - [place]   . AUGMENTATION MAMMAPLASTY  9678,    SILICONE  . BREAST SURGERY    . TUBAL LIGATION       FAMILY HISTORY: No family history on file.   SOCIAL HISTORY:  reports that she has never smoked. She has never used smokeless tobacco. She reports that she drank alcohol. She reports that she does not use drugs. The patient is married and originally from [place]. 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 Her husband works for [employer] and his job relocated them to the [place] about 5 years ago. She has  children who live in [place].   ALLERGIES: Patient has no known allergies.   MEDICATIONS:  Current Outpatient Medications  Medication Sig Dispense Refill  . atorvastatin (LIPITOR) 40 MG tablet     . estrogens, conjugated, (PREMARIN) 0.625 MG tablet Take 0.625 mg by mouth daily. Take daily for 21 days then do not take for 7 days.    Marland Kitchen terbinafine (LAMISIL) 250 MG tablet      No current facility-administered medications for this encounter.      REVIEW OF SYSTEMS: On review of systems, the patient reports that she is doing well overall. She denies any chest pain, shortness of breath, cough, fevers, chills, night sweats, unintended weight changes. She denies any bowel or bladder disturbances, and denies abdominal pain, nausea or vomiting. She denies any new musculoskeletal or joint aches or pains. A complete review of systems is obtained and is otherwise negative.     PHYSICAL EXAM:  Wt Readings from Last 3 Encounters:  05/22/18 151 lb 12.8 oz (68.9 kg)  04/23/18 151 lb (68.5 kg)  09/25/17 147 lb 11.3 oz (67 kg)   Temp Readings from Last 3 Encounters:  05/22/18 98.2 F (36.8 C) (Oral)  09/25/17 98.8 F (37.1 C) (Oral)   BP Readings from Last 3 Encounters:  05/22/18 140/79  04/23/18 120/82  09/25/17 115/73   Pulse Readings from Last 3 Encounters:  05/22/18 81  09/25/17 87  In general this is a well appearing Hispanic female in no acute distress. She is alert and oriented x4 and appropriate throughout the examination. HEENT reveals that the patient is normocephalic, atraumatic. EOMs are intact.  Skin is intact without any evidence of gross lesions. Cardiopulmonary assessment is negative for acute distress and she exhibits normal effort. Breast exam is deferred.   ECOG = 0  0 - Asymptomatic (Fully active, able to carry on all predisease activities without restriction)  1 - Symptomatic but completely ambulatory (Restricted in physically strenuous activity but  ambulatory and able to carry out work of a light or sedentary nature. For example, light housework, office work)  2 - Symptomatic, <50% in bed during the day (Ambulatory and capable of all self care but unable to carry out any work activities. Up and about more than 50% of waking hours)  3 - Symptomatic, >50% in bed, but not bedbound (Capable of only limited self-care, confined to bed or chair 50% or more of waking hours)  4 - Bedbound (Completely disabled. Cannot carry on any self-care. Totally confined to bed or chair)  5 - Death   Eustace Pen MM, Creech RH, Tormey DC, et al. [id number]). "Toxicity and response criteria of the [facility]". [place] Oncol. 5 (6): 649-55    LABORATORY DATA:  Lab Results  Component Value Date   WBC 6.1 05/22/2018   HGB 13.4 05/22/2018   HCT 42.1 05/22/2018   MCV 88.3 05/22/2018   PLT 253 05/22/2018   Lab Results  Component Value Date   NA 143 05/22/2018   K 4.1 05/22/2018   CL 106 05/22/2018   CO2 25 05/22/2018   Lab Results  Component Value Date   ALT 16 05/22/2018   AST 19 05/22/2018   ALKPHOS 48 05/22/2018   BILITOT 0.5 05/22/2018      RADIOGRAPHY: US Breast Ltd Uni Right Inc Axilla  Result Date: 05/08/2018 CLINICAL DATA:  Delayed followup for probably benign mass in the RIGHT breast. It was recommended that the patient return in 6 months after her diagnostic evaluation in December 2017. This is the patient's first exam since that evaluation. EXAM: DIGITAL DIAGNOSTIC BILATERAL MAMMOGRAM WITH IMPLANTS, CAD AND TOMO ULTRASOUND RIGHT BREAST The patient has retropectoral silicone implants. Standard and implant displaced views were performed. COMPARISON:  07/03/2016 ACR Breast Density Category b: There are scattered areas of fibroglandular density. FINDINGS: In the area of previous concern in the UPPER portion of the RIGHT breast, a small mass has resolved. There are 2 new groups of calcifications in the superior aspect of the  RIGHT breast, close to the implant. On additional magnified views, the smaller is a 0.4 centimeter group of fine pleomorphic calcifications associated with a parenchymal density within the UPPER central portion of the RIGHT breast. The second is 0.8 x 0.7 centimeter group of calcifications, also within the UPPER central portion of the breast. The LEFT breast is negative. On physical exam, I palpate no abnormality in the UPPER central portion of the RIGHT breast. Targeted ultrasound is performed, showing a hypoechoic mass with internal calcifications in the 12:30 o'clock location of the RIGHT breast 4 centimeters from the nipple. There is associated internal blood flow and mild posterior acoustic shadowing. Mass is irregular in shape, and margins are indistinct. The mass measures 0.4 x 0.5 x 0.4 centimeters. In the 12:30 o'clock location 6 centimeters from nipple, there is irregular mixed echogenicity region in the breast associated with neither posterior acoustic enhancement  or shadowing. In this region there are small foci consistent with calcifications. Although somewhat indistinct, it is estimated to measure 0.9 x 0.7 x 0.9 centimeters. Measuring OUTER edge to OUTER edge, these 2 masses are 3.3 centimeters apart. Evaluation of the axilla is negative for adenopathy. Mammographic images were processed with CAD. IMPRESSION: 1. Interval resolution of small mass in the UPPER portion of the RIGHT breast for which follow-up was recommended in 2017. 2. Two new areas of calcifications are suspicious for malignancy and biopsy is indicated. The likely sonographic correlates have been identified to facilitate biopsy options, given the presence of implants. We discussed the possibility and low likelihood of implant rupture at the time of biopsy. 3. No adenopathy by ultrasound. RECOMMENDATION: Ultrasound-guided core biopsy is recommended of 2 sites in the 12:30 o'clock location of the RIGHT breast. I have discussed the  findings and recommendations with the patient. Results were also provided in writing at the conclusion of the visit. If applicable, a reminder letter will be sent to the patient regarding the next appointment. BI-RADS CATEGORY  4: Suspicious. Electronically Signed   By: Nolon Nations M.D.   On: 05/08/2018 16:59   Mm Diag Breast W/implant Tomo Bilateral  Result Date: 05/08/2018 CLINICAL DATA:  Delayed followup for probably benign mass in the RIGHT breast. It was recommended that the patient return in 6 months after her diagnostic evaluation in December 2017. This is the patient's first exam since that evaluation. EXAM: DIGITAL DIAGNOSTIC BILATERAL MAMMOGRAM WITH IMPLANTS, CAD AND TOMO ULTRASOUND RIGHT BREAST The patient has retropectoral silicone implants. Standard and implant displaced views were performed. COMPARISON:  07/03/2016 ACR Breast Density Category b: There are scattered areas of fibroglandular density. FINDINGS: In the area of previous concern in the UPPER portion of the RIGHT breast, a small mass has resolved. There are 2 new groups of calcifications in the superior aspect of the RIGHT breast, close to the implant. On additional magnified views, the smaller is a 0.4 centimeter group of fine pleomorphic calcifications associated with a parenchymal density within the UPPER central portion of the RIGHT breast. The second is 0.8 x 0.7 centimeter group of calcifications, also within the UPPER central portion of the breast. The LEFT breast is negative. On physical exam, I palpate no abnormality in the UPPER central portion of the RIGHT breast. Targeted ultrasound is performed, showing a hypoechoic mass with internal calcifications in the 12:30 o'clock location of the RIGHT breast 4 centimeters from the nipple. There is associated internal blood flow and mild posterior acoustic shadowing. Mass is irregular in shape, and margins are indistinct. The mass measures 0.4 x 0.5 x 0.4 centimeters. In the 12:30  o'clock location 6 centimeters from nipple, there is irregular mixed echogenicity region in the breast associated with neither posterior acoustic enhancement or shadowing. In this region there are small foci consistent with calcifications. Although somewhat indistinct, it is estimated to measure 0.9 x 0.7 x 0.9 centimeters. Measuring OUTER edge to OUTER edge, these 2 masses are 3.3 centimeters apart. Evaluation of the axilla is negative for adenopathy. Mammographic images were processed with CAD. IMPRESSION: 1. Interval resolution of small mass in the UPPER portion of the RIGHT breast for which follow-up was recommended in 2017. 2. Two new areas of calcifications are suspicious for malignancy and biopsy is indicated. The likely sonographic correlates have been identified to facilitate biopsy options, given the presence of implants. We discussed the possibility and low likelihood of implant rupture at the time of biopsy. 3.  No adenopathy by ultrasound. RECOMMENDATION: Ultrasound-guided core biopsy is recommended of 2 sites in the 12:30 o'clock location of the RIGHT breast. I have discussed the findings and recommendations with the patient. Results were also provided in writing at the conclusion of the visit. If applicable, a reminder letter will be sent to the patient regarding the next appointment. BI-RADS CATEGORY  4: Suspicious. Electronically Signed   By: Nolon Nations M.D.   On: 05/08/2018 16:59   Mm Clip Placement Right  Result Date: 05/16/2018 CLINICAL DATA:  Status post ultrasound-guided biopsies for 2 sites of suspicious calcifications in the upper RIGHT breast EXAM: DIAGNOSTIC RIGHT MAMMOGRAM POST STEREOTACTIC BIOPSY x2 COMPARISON:  Previous exam(s). FINDINGS: Mammographic images were obtained following ultrasound guided biopsy of 2 sites of suspicious calcifications within the upper RIGHT breast. Ribbon shaped clip is well-positioned at the site of the targeted calcifications within the upper RIGHT  breast (12:30 o'clock axis, 6 cm from nipple). Coil shaped clip is well-positioned at the site of the targeted calcifications within the upper RIGHT breast (12:30 o'clock axis, 4 cm from nipple). X-ray of both specimen containers shows calcifications within both samples. IMPRESSION: 1. Ribbon shaped biopsy clip is well-positioned at the site of the targeted calcifications within the RIGHT breast at the 12:30 o'clock axis, 6 cm from nipple. 2. Coil shaped biopsy clip is well-positioned at the site of the targeted calcifications within the RIGHT breast at the 12:30 o'clock axis, 4 cm from nipple. Final Assessment: Post Procedure Mammograms for Marker Placement Electronically Signed   By: Franki Cabot M.D.   On: 05/16/2018 16:25   [place] Rt Breast Bx W Loc Dev 1st Lesion Img Bx Spec US Guide  Addendum Date: 05/17/2018   ADDENDUM REPORT: 05/17/2018 14:35 ADDENDUM: Pathology revealed HIGH GRADE DUCTAL CARCINOMA IN SITU WITH NECROSIS AND CALCIFICATIONS of the Right breast, 12:30 o'clock 6 cm from nipple. FOCAL HIGH GRADE DUCTAL CARCINOMA IN SITU of the Right breast, 12:30 o'clock, 4 cm from nipple. This was found to be concordant by Dr. Franki Cabot. Pathology results were discussed with the patient and her husband by telephone. The patient reported doing well after the biopsies with tenderness at the sites. Post biopsy instructions and care were reviewed and questions were answered. The patient was encouraged to call [facility] for any additional concerns. The patient was referred to [facility] at [facility] on May 22, 2018. Recommendation for a bilateral breast MRI for further evaluation of extent of disease. Pathology results reported by Terie Purser, RN on 05/17/2018. Electronically Signed   By: Franki Cabot M.D.   On: 05/17/2018 14:35   Result Date: 05/17/2018 CLINICAL DATA:  Patient with 2 sites of suspicious  calcifications within the upper RIGHT breast, both at 12:30 o'clock axis, with sonographic correlate for each site identified on recent diagnostic ultrasound, presents today for ultrasound-guided core biopsies for both sites. EXAM: ULTRASOUND GUIDED RIGHT BREAST CORE NEEDLE BIOPSY x2 COMPARISON:  Previous exam(s). PROCEDURE: I met with the patient and we discussed the procedure of ultrasound-guided biopsy, including benefits and alternatives. We discussed the high likelihood of a successful procedure. We discussed the risks of the procedure including infection, bleeding, tissue injury, clip migration, and inadequate sampling. Informed written consent was given. The usual time-out protocol was performed immediately prior to the procedure. Site 1: Lesion quadrant: Upper inner quadrant Using sterile technique and 1% Lidocaine as local anesthetic, under direct ultrasound visualization, a 12  gauge spring-loaded device was used to perform biopsy of the calcifications in the RIGHT breast at the 12:30 o'clock axis, 6 cm from nipple,using a lateral approach. At the conclusion of the procedure, a ribbon shaped tissue marker was deployed into the biopsy cavity. Site 2: Lesion quadrant: Upper inner quadrant Using sterile technique and 1% Lidocaine as local anesthetic, under direct ultrasound visualization, a 12 gauge spring-loaded device was used to perform biopsy of the calcifications in the RIGHT breast at the 12:30 o'clock axis, 4 cm from nipple,using a lateral approach. At the conclusion of the procedure, a coil shaped tissue marker clip was deployed into the biopsy cavity. Follow-up 2-view mammogram was performed and dictated separately. IMPRESSION: 1. Ultrasound-guided biopsy of the RIGHT breast calcifications at the 12:30 o'clock axis, 6 cm from the nipple. Ribbon shaped clip placed at the biopsy site. 2. Ultrasound-guided biopsy of the RIGHT breast calcifications at the 12:30 o'clock axis, 4 cm from the nipple. Coil  shaped clip placed at the biopsy site. Electronically Signed: By: Franki Cabot M.D. On: 05/16/2018 14:52   [place] Rt Breast Bx W Loc Dev Ea Add Lesion Img Bx Spec US Guide  Addendum Date: 05/17/2018   ADDENDUM REPORT: 05/17/2018 14:35 ADDENDUM: Pathology revealed HIGH GRADE DUCTAL CARCINOMA IN SITU WITH NECROSIS AND CALCIFICATIONS of the Right breast, 12:30 o'clock 6 cm from nipple. FOCAL HIGH GRADE DUCTAL CARCINOMA IN SITU of the Right breast, 12:30 o'clock, 4 cm from nipple. This was found to be concordant by Dr. Franki Cabot. Pathology results were discussed with the patient and her husband by telephone. The patient reported doing well after the biopsies with tenderness at the sites. Post biopsy instructions and care were reviewed and questions were answered. The patient was encouraged to call [facility] for any additional concerns. The patient was referred to [facility] at [facility] on May 22, 2018. Recommendation for a bilateral breast MRI for further evaluation of extent of disease. Pathology results reported by Terie Purser, RN on 05/17/2018. Electronically Signed   By: Franki Cabot M.D.   On: 05/17/2018 14:35   Result Date: 05/17/2018 CLINICAL DATA:  Patient with 2 sites of suspicious calcifications within the upper RIGHT breast, both at 12:30 o'clock axis, with sonographic correlate for each site identified on recent diagnostic ultrasound, presents today for ultrasound-guided core biopsies for both sites. EXAM: ULTRASOUND GUIDED RIGHT BREAST CORE NEEDLE BIOPSY x2 COMPARISON:  Previous exam(s). PROCEDURE: I met with the patient and we discussed the procedure of ultrasound-guided biopsy, including benefits and alternatives. We discussed the high likelihood of a successful procedure. We discussed the risks of the procedure including infection, bleeding, tissue injury, clip migration, and inadequate sampling.  Informed written consent was given. The usual time-out protocol was performed immediately prior to the procedure. Site 1: Lesion quadrant: Upper inner quadrant Using sterile technique and 1% Lidocaine as local anesthetic, under direct ultrasound visualization, a 12 gauge spring-loaded device was used to perform biopsy of the calcifications in the RIGHT breast at the 12:30 o'clock axis, 6 cm from nipple,using a lateral approach. At the conclusion of the procedure, a ribbon shaped tissue marker was deployed into the biopsy cavity. Site 2: Lesion quadrant: Upper inner quadrant Using sterile technique and 1% Lidocaine as local anesthetic, under direct ultrasound visualization, a 12 gauge spring-loaded device was used to perform biopsy of the calcifications in the RIGHT breast at the 12:30 o'clock axis, 4 cm from nipple,using  a lateral approach. At the conclusion of the procedure, a coil shaped tissue marker clip was deployed into the biopsy cavity. Follow-up 2-view mammogram was performed and dictated separately. IMPRESSION: 1. Ultrasound-guided biopsy of the RIGHT breast calcifications at the 12:30 o'clock axis, 6 cm from the nipple. Ribbon shaped clip placed at the biopsy site. 2. Ultrasound-guided biopsy of the RIGHT breast calcifications at the 12:30 o'clock axis, 4 cm from the nipple. Coil shaped clip placed at the biopsy site. Electronically Signed: By: Franki Cabot M.D. On: 05/16/2018 14:52       IMPRESSION/PLAN: 1. High Grade ER positive DCIS of the right breast. Dr. Lisbeth Renshaw discusses the pathology findings and reviews the nature of noninvasive breast disease. The consensus from the breast conference includes breast conservation with lumpectomy which she is interested in. She has in situ breast implants that she would like to keep. She wold be benefited by adjuvant radiotherapy. She would also be benefited by adjuvant antiestrogen therapy, and the patient is interested in proceeding. Dr. Lisbeth Renshaw discusses the  delivery and logistics of radiotherapy and anticipates a course of 6 1/2 weeks of radiotherapy given her in situ implants. We will see her back about 2 weeks after surgery to discuss the simulation process and anticipate starting radiotherapy about 4-6 weeks after surgery.   In a visit lasting 45 minutes, greater than 50% of the time was spent face to face discussing her case, and coordinating the patient's care.  The above documentation reflects my direct findings during this shared patient visit. Please see the separate note by Dr. Lisbeth Renshaw on this date for the remainder of the patient's plan of care.    Carola Rhine, PAC

## 2018-05-23 ENCOUNTER — Encounter: Payer: Self-pay | Admitting: Hematology

## 2018-05-23 ENCOUNTER — Telehealth: Payer: Self-pay | Admitting: Hematology

## 2018-05-23 DIAGNOSIS — K635 Polyp of colon: Secondary | ICD-10-CM | POA: Diagnosis not present

## 2018-05-23 DIAGNOSIS — K52832 Lymphocytic colitis: Secondary | ICD-10-CM | POA: Diagnosis not present

## 2018-05-23 DIAGNOSIS — R197 Diarrhea, unspecified: Secondary | ICD-10-CM | POA: Diagnosis not present

## 2018-05-23 NOTE — Telephone Encounter (Signed)
No los 10/23

## 2018-05-24 ENCOUNTER — Other Ambulatory Visit: Payer: Self-pay | Admitting: General Surgery

## 2018-05-24 ENCOUNTER — Other Ambulatory Visit: Payer: Self-pay | Admitting: *Deleted

## 2018-05-24 DIAGNOSIS — C50811 Malignant neoplasm of overlapping sites of right female breast: Secondary | ICD-10-CM

## 2018-05-24 DIAGNOSIS — D0511 Intraductal carcinoma in situ of right breast: Secondary | ICD-10-CM

## 2018-05-28 ENCOUNTER — Encounter: Payer: Self-pay | Admitting: Radiation Oncology

## 2018-05-28 DIAGNOSIS — K52832 Lymphocytic colitis: Secondary | ICD-10-CM | POA: Diagnosis not present

## 2018-05-28 DIAGNOSIS — K635 Polyp of colon: Secondary | ICD-10-CM | POA: Diagnosis not present

## 2018-05-31 ENCOUNTER — Telehealth: Payer: Self-pay | Admitting: *Deleted

## 2018-05-31 NOTE — Telephone Encounter (Signed)
  Oncology Nurse Navigator Documentation  Navigator Location: [facility] (05/31/18 1000)   )Navigator Encounter Type: Telephone;[facility] Follow-up (05/31/18 1000) Telephone: Outgoing Call;Clinic/[facility] Follow-up (05/31/18 1000)     Surgery Date: 06/10/18 (05/31/18 1000)           Treatment Initiated Date: 06/10/18 (05/31/18 1000)                                Time Spent with Patient: 15 (05/31/18 1000)

## 2018-06-03 ENCOUNTER — Encounter (HOSPITAL_BASED_OUTPATIENT_CLINIC_OR_DEPARTMENT_OTHER): Payer: Self-pay | Admitting: *Deleted

## 2018-06-07 ENCOUNTER — Ambulatory Visit
Admission: RE | Admit: 2018-06-07 | Discharge: 2018-06-07 | Disposition: A | Discharge status: Home / Self Care | Payer: BLUE CROSS/BLUE SHIELD | Source: Ambulatory Visit | Attending: General Surgery | Admitting: General Surgery

## 2018-06-07 DIAGNOSIS — D0511 Intraductal carcinoma in situ of right breast: Secondary | ICD-10-CM | POA: Diagnosis not present

## 2018-06-07 DIAGNOSIS — C50811 Malignant neoplasm of overlapping sites of right female breast: Secondary | ICD-10-CM

## 2018-06-07 NOTE — Progress Notes (Signed)
Ensure pre surgery drink given with instructions to complete by 0415 dos, pt verbalized understanding. 

## 2018-06-09 ENCOUNTER — Encounter (HOSPITAL_BASED_OUTPATIENT_CLINIC_OR_DEPARTMENT_OTHER): Payer: Self-pay | Admitting: Anesthesiology

## 2018-06-09 NOTE — Anesthesia Preprocedure Evaluation (Addendum)
Anesthesia Evaluation  Patient identified by MRN, date of birth, ID band Patient awake    Reviewed: Allergy & Precautions, NPO status , Patient's Chart, lab work & pertinent test results  Airway Mallampati: II  TM Distance: >3 FB Neck ROM: Full    Dental no notable dental hx. (+) Teeth Intact   Pulmonary neg pulmonary ROS,    Pulmonary exam normal breath sounds clear to auscultation       Cardiovascular negative cardio ROS Normal cardiovascular exam Rhythm:Regular Rate:Normal     Neuro/Psych PSYCHIATRIC DISORDERS Depression negative neurological ROS     GI/Hepatic negative GI ROS, Neg liver ROS,   Endo/Other  Multifocal DCIS right breast Hyperlipidemia  Renal/GU negative Renal ROS  negative genitourinary   Musculoskeletal negative musculoskeletal ROS (+)   Abdominal   Peds  Hematology   Anesthesia Other Findings   Reproductive/Obstetrics                            Anesthesia Physical Anesthesia Plan  ASA: II  Anesthesia Plan: General   Post-op Pain Management:    Induction: Intravenous  PONV Risk Score and Plan: 4 or greater and Ondansetron, Dexamethasone, Scopolamine patch - Pre-op, Midazolam and Treatment may vary due to age or medical condition  Airway Management Planned: LMA  Additional Equipment:   Intra-op Plan:   Post-operative Plan: Extubation in OR  Informed Consent: I have reviewed the patients History and Physical, chart, labs and discussed the procedure including the risks, benefits and alternatives for the proposed anesthesia with the patient or authorized representative who has indicated his/her understanding and acceptance.   Dental advisory given  Plan Discussed with: CRNA and Surgeon  Anesthesia Plan Comments:        Anesthesia Quick Evaluation

## 2018-06-09 NOTE — H&P (Signed)
Robin Boyd [facility]: [facility] Patient #: [id number] DOB: [birth year] Undefined / Language: [place] / Race: Native Hawaiian or Other [place] Female        History of Present Illness       This is a very pleasant 62 year old Hispanic female from [place]. Her husband is with her throughout the encounter. She is seen in the [facility] by Dr. Burr Medico, Dr. Lisbeth Renshaw, and me. Her primary physician is Dr. Lenn Cal. She is also followed by [facility], Robin Alas, NP for gynecology      The only prior breast issues are bilateral augmentation mammoplasty 10 years ago in [place]. She does not know the technique but thinks this is silicone. Last mammograms 2 years ago. Recent mammogram showed 2 small areas of calcifications in the right breast. Category B density. Both of these were biopsied. These are closed but not involving the capsule of the implant. 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 PR 0 A second area of calcifications, also at the 12:30 position, 4 cm from the nipple also shows high-grade DCIS. Insufficient for breast diagnostic profile        Past history reveals abdominal hysterectomy without BSO through Pfannenstiel incision. Augmentation mammoplasty 10 years ago in [place]. Prior tubal ligation. Hyperlipidemia. Otherwise healthy Family history is negative for breast, ovarian, prostate, or colon cancer mother living with hypertension. Father's health history is unknown Social history reveals she is married with 2 children. Her husband is with her throughout the encounter today. 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 She is not practicing medicine now. Her husband is a Land for bother trucks. One child lives in [place] and one lives in [place]      We  had a very long discussion almost 1 hour to discuss options and surgical management. We talked about lumpectomy with double radioactive seed localization, whole breast radiation therapy and antiestrogen therapy. We compared that to mastectomy, sentinel node, ultimate reconstruction. I told her I did not see any survival advantage with mastectomy. I told her my bias was the lumpectomy. She is strongly motivated for breast conservation I think she is a reasonable candidate for that. We had a long talk about this. She knows that the 2 areas are close to the implant and I drew pictures to describe it to her and her husband. She does that there is a higher risk for positive margin. Small but definite risk of implant injury and removal of the implant requiring reconstruction later. She has is a possibility that there is invasive cancer but hopefully not. She'll be scheduled for right breast lumpectomy 2 with radioactive seed localization 2. I discussed the indications, details, techniques, numerous risk of the surgery with her and her husband. They're aware of the risk of bleeding, infection, reoperation for positive margins or invasive disease, injury to the implant requiring reconstruction at a later date. Nerve damage with chronic pain. Problems with innervation and sensitivity of the nipple. She understands all of these issues. All her questions were answered. She agrees with this plan.      Past Surgical History  Breast Augmentation  Bilateral. Hysterectomy (not due to cancer) - Complete  Tonsillectomy   Diagnostic Studies History  Colonoscopy  never Mammogram  within last year Pap Smear  >5 years ago  Medication History  Medications Reconciled  Social History ( Caffeine use  Coffee, Tea. No alcohol use  No drug use  Tobacco use  Never smoker.  Family History  Family history unknown  First Degree Relatives   Pregnancy / Birth History Age of menopause   67-50 Gravida  2 Maternal age  7-30 Para  2  Other Problems  Back Pain  Gastroesophageal Reflux Disease  Hypercholesterolemia     Review of Systems  General Present- Night Sweats. Not Present- Appetite Loss, Chills, Fatigue, Fever, Weight Gain and Weight Loss. Skin Not Present- Change in Wart/Mole, Dryness, Hives, Jaundice, New Lesions, Non-Healing Wounds, Rash and Ulcer. HEENT Present- Wears glasses/contact lenses. Not Present- Earache, Hearing Loss, Hoarseness, Nose Bleed, Oral Ulcers, Ringing in the Ears, Seasonal Allergies, Sinus Pain, Sore Throat, Visual Disturbances and Yellow Eyes. Respiratory Not Present- Bloody sputum, Chronic Cough, Difficulty Breathing, Snoring and Wheezing. Cardiovascular Not Present- Chest Pain, Difficulty Breathing Lying Down, Leg Cramps, Palpitations, Rapid Heart Rate, Shortness of Breath and Swelling of Extremities. Gastrointestinal Present- Chronic diarrhea and Hemorrhoids. Not Present- Abdominal Pain, Bloating, Bloody Stool, Change in Bowel Habits, Constipation, Difficulty Swallowing, Excessive gas, Gets full quickly at meals, Indigestion, Nausea, Rectal Pain and Vomiting. Female Genitourinary Not Present- Frequency, Nocturia, Painful Urination, Pelvic Pain and Urgency. Musculoskeletal Not Present- Back Pain, Joint Pain, Joint Stiffness, Muscle Pain, Muscle Weakness and Swelling of Extremities. Neurological Not Present- Decreased Memory, Fainting, Headaches, Numbness, Seizures, Tingling, Tremor, Trouble walking and Weakness. Psychiatric Present- Change in Sleep Pattern. Not Present- Anxiety, Bipolar, Depression, Fearful and Frequent crying. Endocrine Present- Heat Intolerance and Hot flashes. Not Present- Cold Intolerance, Excessive Hunger, Hair Changes and New Diabetes. Hematology Not Present- Blood Thinners, Easy Bruising, Excessive bleeding, Gland problems, HIV and Persistent Infections.   Physical Exam General Mental Status-Alert. General  Appearance-Consistent with stated age. Hydration-Well hydrated. Voice-Normal. Note: Pleasant. Intelligent. 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   Head and Neck Head-normocephalic, atraumatic with no lesions or palpable masses. Trachea-midline. Thyroid Gland Characteristics - normal size and consistency.  Eye Eyeball - Bilateral-Extraocular movements intact. Sclera/Conjunctiva - Bilateral-No scleral icterus.  Chest and Lung Exam Chest and lung exam reveals -quiet, even and easy respiratory effort with no use of accessory muscles and on auscultation, normal breath sounds, no adventitious sounds and normal vocal resonance. Inspection Chest Wall - Normal. Back - normal.  Breast Note: Good symmetry and plastic surgical result from her implants. Well-healed incisions to go all the way around the areola. There are no other incisions. Biopsy sites noted on the right.. No hematoma. Minimal ecchymoses. No palpable mass or adenopathy on either side.   Cardiovascular Cardiovascular examination reveals -normal heart sounds, regular rate and rhythm with no murmurs and normal pedal pulses bilaterally.  Abdomen Inspection Inspection of the abdomen reveals - No Hernias. Skin - Scar - Note: Healed Pfannenstiel incision. Palpation/Percussion Palpation and Percussion of the abdomen reveal - Soft, Non Tender, No Rebound tenderness, No Rigidity (guarding) and No hepatosplenomegaly. Auscultation Auscultation of the abdomen reveals - Bowel sounds normal.  Neurologic Neurologic evaluation reveals -alert and oriented x 3 with no impairment of recent or remote memory. Mental Status-Normal.  Musculoskeletal Normal Exam - Left-Upper Extremity Strength Normal and Lower Extremity Strength Normal. Normal Exam - Right-Upper Extremity Strength Normal and Lower Extremity Strength Normal.  Lymphatic Head & Neck  General Head & Neck Lymphatics: Bilateral -  Description - Normal. Axillary  General Axillary Region: Bilateral - Description - Normal. Tenderness - Non Tender.  Femoral & Inguinal  Generalized Femoral & Inguinal Lymphatics: Bilateral - Description - Normal. Tenderness - Non Tender.    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 We have compared that to mastectomy with or without reconstruction you seemed to be in favor of breast conservation and I think you are a good candidate for that  you will be scheduled for a right breast lumpectomy 2 with radioactive seed localization 2 you will not need any lymph node biopsies unless we are surprised to find invasive cancer on the final pathology I've discussed the indications, techniques, and risk of this surgery in detail You are aware of the risk of reoperation for positive margins, injury and removal of the implant which would require future plastic surgery.  Dr. Darrel Hoover office will call you in the next day or 2 to schedule the surgery, hopefully within the next 2-3 weeks  HISTORY OF AUGMENTATION MAMMOPLASTY (Z98.82) H/O ABDOMINAL HYSTERECTOMY (Z90.710) Impression: Ovaries not removed. Atrophic by ultrasound according to patient    Edsel Petrin. Dalbert Batman, M.D., [facility] General and Minimally invasive Surgery Breast and Colorectal Surgery Office:   [phone] Pager:   [phone]

## 2018-06-10 ENCOUNTER — Encounter (HOSPITAL_BASED_OUTPATIENT_CLINIC_OR_DEPARTMENT_OTHER): Payer: Self-pay | Admitting: Certified Registered"

## 2018-06-10 ENCOUNTER — Ambulatory Visit
Admission: RE | Admit: 2018-06-10 | Discharge: 2018-06-10 | Disposition: A | Discharge status: Home / Self Care | Payer: BLUE CROSS/BLUE SHIELD | Source: Ambulatory Visit | Attending: General Surgery | Admitting: General Surgery

## 2018-06-10 ENCOUNTER — Ambulatory Visit (HOSPITAL_BASED_OUTPATIENT_CLINIC_OR_DEPARTMENT_OTHER)
Admission: RE | Admit: 2018-06-10 | Discharge: 2018-06-10 | Disposition: A | Discharge status: Home / Self Care | Payer: BLUE CROSS/BLUE SHIELD | Source: Ambulatory Visit | Attending: General Surgery | Admitting: General Surgery

## 2018-06-10 ENCOUNTER — Ambulatory Visit (HOSPITAL_BASED_OUTPATIENT_CLINIC_OR_DEPARTMENT_OTHER): Payer: BLUE CROSS/BLUE SHIELD | Admitting: Anesthesiology

## 2018-06-10 ENCOUNTER — Ambulatory Visit
Admission: RE | Admit: 2018-06-10 | Discharge: 2018-06-10 | Disposition: A | Discharge status: Home / Self Care | Payer: BLUE CROSS/BLUE SHIELD | Source: Ambulatory Visit

## 2018-06-10 ENCOUNTER — Encounter (HOSPITAL_BASED_OUTPATIENT_CLINIC_OR_DEPARTMENT_OTHER)
Admission: RE | Disposition: A | Discharge status: Home / Self Care | Payer: Self-pay | Source: Ambulatory Visit | Attending: General Surgery

## 2018-06-10 DIAGNOSIS — D0511 Intraductal carcinoma in situ of right breast: Secondary | ICD-10-CM | POA: Insufficient documentation

## 2018-06-10 DIAGNOSIS — C50811 Malignant neoplasm of overlapping sites of right female breast: Secondary | ICD-10-CM

## 2018-06-10 DIAGNOSIS — C50211 Malignant neoplasm of upper-inner quadrant of right female breast: Secondary | ICD-10-CM | POA: Diagnosis not present

## 2018-06-10 DIAGNOSIS — Z170 Estrogen receptor positive status [ER+]: Secondary | ICD-10-CM | POA: Insufficient documentation

## 2018-06-10 HISTORY — DX: Major depressive disorder, single episode, unspecified: F32.9

## 2018-06-10 HISTORY — DX: Depression, unspecified: F32.A

## 2018-06-10 HISTORY — PX: BREAST LUMPECTOMY WITH RADIOACTIVE SEED LOCALIZATION: SHX6424

## 2018-06-10 SURGERY — BREAST LUMPECTOMY WITH RADIOACTIVE SEED LOCALIZATION
Anesthesia: General | Site: Breast | Laterality: Right

## 2018-06-10 MED ORDER — BUPIVACAINE-EPINEPHRINE (PF) 0.25% -1:200000 IJ SOLN
INTRAMUSCULAR | Status: AC
  Filled 2018-06-10: qty 120

## 2018-06-10 MED ORDER — SCOPOLAMINE 1 MG/3DAYS TD PT72
1.0000 | MEDICATED_PATCH | Freq: Once | TRANSDERMAL | Status: AC | PRN
  Administered 2018-06-10: 1 via TRANSDERMAL

## 2018-06-10 MED ORDER — CELECOXIB 200 MG PO CAPS
ORAL_CAPSULE | ORAL | Status: AC
  Filled 2018-06-10: qty 1

## 2018-06-10 MED ORDER — HYDROCODONE-ACETAMINOPHEN 5-325 MG PO TABS: 1.0000 | ORAL_TABLET | Freq: Four times a day (QID) | ORAL | 0 refills | Status: DC | PRN

## 2018-06-10 MED ORDER — MEPERIDINE HCL 25 MG/ML IJ SOLN: 6.2500 mg | INTRAMUSCULAR | Status: DC | PRN

## 2018-06-10 MED ORDER — OXYCODONE HCL 5 MG/5ML PO SOLN: 5.0000 mg | Freq: Once | ORAL | Status: DC | PRN

## 2018-06-10 MED ORDER — ACETAMINOPHEN 500 MG PO TABS: 1000.0000 mg | ORAL_TABLET | Freq: Four times a day (QID) | ORAL | Status: DC

## 2018-06-10 MED ORDER — ACETAMINOPHEN 500 MG PO TABS
ORAL_TABLET | ORAL | Status: AC
  Filled 2018-06-10: qty 2

## 2018-06-10 MED ORDER — LACTATED RINGERS IV SOLN: INTRAVENOUS | Status: DC

## 2018-06-10 MED ORDER — ACETAMINOPHEN 500 MG PO TABS
1000.0000 mg | ORAL_TABLET | ORAL | Status: AC
  Administered 2018-06-10: 1000 mg via ORAL

## 2018-06-10 MED ORDER — LIDOCAINE HCL (CARDIAC) PF 100 MG/5ML IV SOSY
PREFILLED_SYRINGE | INTRAVENOUS | Status: DC | PRN
  Administered 2018-06-10: 60 mg via INTRAVENOUS

## 2018-06-10 MED ORDER — FENTANYL CITRATE (PF) 100 MCG/2ML IJ SOLN
50.0000 ug | INTRAMUSCULAR | Status: DC | PRN
  Administered 2018-06-10 (×2): 50 ug via INTRAVENOUS

## 2018-06-10 MED ORDER — CEFAZOLIN SODIUM-DEXTROSE 2-4 GM/100ML-% IV SOLN
2.0000 g | INTRAVENOUS | Status: AC
  Administered 2018-06-10: 2 g via INTRAVENOUS

## 2018-06-10 MED ORDER — SCOPOLAMINE 1 MG/3DAYS TD PT72: 1.0000 | MEDICATED_PATCH | TRANSDERMAL | Status: DC

## 2018-06-10 MED ORDER — DEXAMETHASONE SODIUM PHOSPHATE 4 MG/ML IJ SOLN
INTRAMUSCULAR | Status: DC | PRN
  Administered 2018-06-10: 10 mg via INTRAVENOUS

## 2018-06-10 MED ORDER — ONDANSETRON HCL 4 MG/2ML IJ SOLN
INTRAMUSCULAR | Status: DC | PRN
  Administered 2018-06-10: 4 mg via INTRAVENOUS

## 2018-06-10 MED ORDER — METOCLOPRAMIDE HCL 5 MG/ML IJ SOLN: 10.0000 mg | Freq: Once | INTRAMUSCULAR | Status: DC | PRN

## 2018-06-10 MED ORDER — HEPARIN (PORCINE) IN NACL 1000-0.9 UT/500ML-% IV SOLN
INTRAVENOUS | Status: AC
  Filled 2018-06-10: qty 500

## 2018-06-10 MED ORDER — CELECOXIB 200 MG PO CAPS
200.0000 mg | ORAL_CAPSULE | ORAL | Status: AC
  Administered 2018-06-10: 200 mg via ORAL

## 2018-06-10 MED ORDER — SODIUM CHLORIDE 0.9% FLUSH: 3.0000 mL | INTRAVENOUS | Status: DC | PRN

## 2018-06-10 MED ORDER — OXYCODONE HCL 5 MG PO TABS: 5.0000 mg | ORAL_TABLET | Freq: Once | ORAL | Status: DC | PRN

## 2018-06-10 MED ORDER — CHLORHEXIDINE GLUCONATE CLOTH 2 % EX PADS: 6.0000 | MEDICATED_PAD | Freq: Once | CUTANEOUS | Status: DC

## 2018-06-10 MED ORDER — CEFAZOLIN SODIUM-DEXTROSE 2-4 GM/100ML-% IV SOLN
INTRAVENOUS | Status: AC
  Filled 2018-06-10: qty 100

## 2018-06-10 MED ORDER — FENTANYL CITRATE (PF) 100 MCG/2ML IJ SOLN
INTRAMUSCULAR | Status: AC
  Filled 2018-06-10: qty 2

## 2018-06-10 MED ORDER — ACETAMINOPHEN 650 MG RE SUPP: 650.0000 mg | RECTAL | Status: DC | PRN

## 2018-06-10 MED ORDER — LACTATED RINGERS IV SOLN
INTRAVENOUS | Status: DC
  Administered 2018-06-10 (×2): via INTRAVENOUS

## 2018-06-10 MED ORDER — SODIUM CHLORIDE 0.9% FLUSH: 3.0000 mL | Freq: Two times a day (BID) | INTRAVENOUS | Status: DC

## 2018-06-10 MED ORDER — OXYCODONE HCL 5 MG PO TABS: 5.0000 mg | ORAL_TABLET | ORAL | Status: DC | PRN

## 2018-06-10 MED ORDER — FENTANYL CITRATE (PF) 100 MCG/2ML IJ SOLN: 25.0000 ug | INTRAMUSCULAR | Status: DC | PRN

## 2018-06-10 MED ORDER — SODIUM CHLORIDE 0.9 % IV SOLN: 250.0000 mL | INTRAVENOUS | Status: DC | PRN

## 2018-06-10 MED ORDER — GABAPENTIN 300 MG PO CAPS
300.0000 mg | ORAL_CAPSULE | ORAL | Status: AC
  Administered 2018-06-10: 300 mg via ORAL

## 2018-06-10 MED ORDER — BUPIVACAINE-EPINEPHRINE (PF) 0.25% -1:200000 IJ SOLN
INTRAMUSCULAR | Status: DC | PRN
  Administered 2018-06-10: 5 mL

## 2018-06-10 MED ORDER — GABAPENTIN 300 MG PO CAPS
ORAL_CAPSULE | ORAL | Status: AC
  Filled 2018-06-10: qty 1

## 2018-06-10 MED ORDER — HEPARIN SOD (PORK) LOCK FLUSH 100 UNIT/ML IV SOLN
INTRAVENOUS | Status: AC
  Filled 2018-06-10: qty 5

## 2018-06-10 MED ORDER — MIDAZOLAM HCL 2 MG/2ML IJ SOLN: 1.0000 mg | INTRAMUSCULAR | Status: DC | PRN

## 2018-06-10 MED ORDER — MIDAZOLAM HCL 2 MG/2ML IJ SOLN
INTRAMUSCULAR | Status: AC
  Filled 2018-06-10: qty 2

## 2018-06-10 MED ORDER — PROPOFOL 10 MG/ML IV BOLUS
INTRAVENOUS | Status: DC | PRN
  Administered 2018-06-10: 150 mg via INTRAVENOUS

## 2018-06-10 MED ORDER — ACETAMINOPHEN 325 MG PO TABS: 650.0000 mg | ORAL_TABLET | ORAL | Status: DC | PRN

## 2018-06-10 SURGICAL SUPPLY — 61 items
APPLIER CLIP 9.375 MED OPEN (MISCELLANEOUS) ×2
BENZOIN TINCTURE PRP APPL 2/3 (GAUZE/BANDAGES/DRESSINGS) IMPLANT
BINDER BREAST LRG (GAUZE/BANDAGES/DRESSINGS) ×2 IMPLANT
BINDER BREAST MEDIUM (GAUZE/BANDAGES/DRESSINGS) IMPLANT
BINDER BREAST XLRG (GAUZE/BANDAGES/DRESSINGS) IMPLANT
BINDER BREAST XXLRG (GAUZE/BANDAGES/DRESSINGS) IMPLANT
BLADE HEX COATED 2.75 (ELECTRODE) ×2 IMPLANT
BLADE SURG 10 STRL SS (BLADE) IMPLANT
BLADE SURG 15 STRL LF DISP TIS (BLADE) ×1 IMPLANT
BLADE SURG 15 STRL SS (BLADE) ×1
CANISTER SUC SOCK COL 7IN (MISCELLANEOUS) IMPLANT
CANISTER SUCT 1200ML W/VALVE (MISCELLANEOUS) ×2 IMPLANT
CHLORAPREP W/TINT 26ML (MISCELLANEOUS) ×2 IMPLANT
CLIP APPLIE 9.375 MED OPEN (MISCELLANEOUS) ×1 IMPLANT
COVER BACK TABLE 60X90IN (DRAPES) ×2 IMPLANT
COVER MAYO STAND STRL (DRAPES) ×2 IMPLANT
COVER PROBE W GEL 5X96 (DRAPES) ×2 IMPLANT
COVER WAND RF STERILE (DRAPES) IMPLANT
DECANTER SPIKE VIAL GLASS SM (MISCELLANEOUS) IMPLANT
DERMABOND ADVANCED (GAUZE/BANDAGES/DRESSINGS) ×1
DERMABOND ADVANCED .7 DNX12 (GAUZE/BANDAGES/DRESSINGS) ×1 IMPLANT
DEVICE DUBIN W/COMP PLATE 8390 (MISCELLANEOUS) ×2 IMPLANT
DRAPE LAPAROSCOPIC ABDOMINAL (DRAPES) ×2 IMPLANT
DRAPE UTILITY XL STRL (DRAPES) ×2 IMPLANT
DRSG PAD ABDOMINAL 8X10 ST (GAUZE/BANDAGES/DRESSINGS) ×2 IMPLANT
ELECT REM PT RETURN 9FT ADLT (ELECTROSURGICAL) ×2
ELECTRODE REM PT RTRN 9FT ADLT (ELECTROSURGICAL) ×1 IMPLANT
GAUZE SPONGE 4X4 12PLY STRL LF (GAUZE/BANDAGES/DRESSINGS) ×2 IMPLANT
GLOVE BIO SURGEON STRL SZ 6.5 (GLOVE) ×6 IMPLANT
GLOVE BIOGEL PI IND STRL 7.0 (GLOVE) ×1 IMPLANT
GLOVE BIOGEL PI INDICATOR 7.0 (GLOVE) ×1
GLOVE EUDERMIC 7 POWDERFREE (GLOVE) ×2 IMPLANT
GOWN STRL REUS W/ TWL LRG LVL3 (GOWN DISPOSABLE) ×2 IMPLANT
GOWN STRL REUS W/ TWL XL LVL3 (GOWN DISPOSABLE) ×1 IMPLANT
GOWN STRL REUS W/TWL LRG LVL3 (GOWN DISPOSABLE) ×2
GOWN STRL REUS W/TWL XL LVL3 (GOWN DISPOSABLE) ×1
ILLUMINATOR WAVEGUIDE N/F (MISCELLANEOUS) IMPLANT
KIT MARKER MARGIN INK (KITS) ×2 IMPLANT
LIGHT WAVEGUIDE WIDE FLAT (MISCELLANEOUS) IMPLANT
NEEDLE HYPO 25X1 1.5 SAFETY (NEEDLE) ×2 IMPLANT
NS IRRIG 1000ML POUR BTL (IV SOLUTION) ×2 IMPLANT
PACK BASIN DAY SURGERY FS (CUSTOM PROCEDURE TRAY) ×2 IMPLANT
PENCIL BUTTON HOLSTER BLD 10FT (ELECTRODE) ×2 IMPLANT
SHEET MEDIUM DRAPE 40X70 STRL (DRAPES) IMPLANT
SLEEVE SCD COMPRESS KNEE MED (MISCELLANEOUS) ×2 IMPLANT
SPONGE LAP 18X18 RF (DISPOSABLE) IMPLANT
SPONGE LAP 4X18 RFD (DISPOSABLE) ×2 IMPLANT
STRIP CLOSURE SKIN 1/2X4 (GAUZE/BANDAGES/DRESSINGS) IMPLANT
SUT ETHILON 3 0 FSL (SUTURE) IMPLANT
SUT MNCRL AB 4-0 PS2 18 (SUTURE) ×2 IMPLANT
SUT SILK 2 0 SH (SUTURE) ×2 IMPLANT
SUT VIC AB 2-0 CT1 27 (SUTURE)
SUT VIC AB 2-0 CT1 TAPERPNT 27 (SUTURE) IMPLANT
SUT VIC AB 3-0 SH 27 (SUTURE)
SUT VIC AB 3-0 SH 27X BRD (SUTURE) IMPLANT
SUT VICRYL 3-0 CR8 SH (SUTURE) ×2 IMPLANT
SYR 10ML LL (SYRINGE) ×2 IMPLANT
TOWEL GREEN STERILE FF (TOWEL DISPOSABLE) ×2 IMPLANT
TOWEL OR NON WOVEN STRL DISP B (DISPOSABLE) IMPLANT
TUBE CONNECTING 20X1/4 (TUBING) ×2 IMPLANT
YANKAUER SUCT BULB TIP NO VENT (SUCTIONS) ×2 IMPLANT

## 2018-06-10 NOTE — Interval H&P Note (Signed)
History and Physical Interval Note:  06/10/2018 7:05 AM  Robin Boyd  has presented today for surgery, with the diagnosis of MULTIFOCAL DCIS RIGHT BREAST  The various methods of treatment have been discussed with the patient and family. After consideration of risks, benefits and other options for treatment, the patient has consented to  Procedure(s): BREAST LUMPECTOMY X2 WITH RADIOACTIVE SEED LOCALIZATION X2 (Right) as a surgical intervention .  The patient's history has been reviewed, patient examined, no change in status, stable for surgery.  I have reviewed the patient's chart and labs.  Questions were answered to the patient's satisfaction.     Adin Hector

## 2018-06-10 NOTE — Addendum Note (Signed)
Addendum  created 06/10/18 0857 by Clavin Ruhlman, Ernesta Amble, CRNA   Intraprocedure Meds edited

## 2018-06-10 NOTE — Anesthesia Procedure Notes (Signed)
Procedure Name: LMA Insertion Date/Time: 06/10/2018 7:30 AM Performed by: Signe Colt, CRNA Pre-anesthesia Checklist: Patient identified, Emergency Drugs available, Suction available and Patient being monitored Patient Re-evaluated:Patient Re-evaluated prior to induction Oxygen Delivery Method: Circle system utilized Preoxygenation: Pre-oxygenation with 100% oxygen Induction Type: IV induction Ventilation: Mask ventilation without difficulty LMA: LMA inserted LMA Size: 4.0 Number of attempts: 1 Airway Equipment and Method: Bite block Placement Confirmation: positive ETCO2 Tube secured with: Tape Dental Injury: Teeth and Oropharynx as per pre-operative assessment

## 2018-06-10 NOTE — Discharge Instructions (Signed)
[facility] °Office Phone Number [phone] ° °BREAST BIOPSY/ PARTIAL MASTECTOMY: POST OP INSTRUCTIONS ° °Always review your discharge instruction sheet given to you by the facility where your surgery was performed. ° °IF YOU HAVE DISABILITY OR FAMILY LEAVE FORMS, YOU MUST BRING THEM TO THE OFFICE FOR PROCESSING.  DO NOT GIVE THEM TO YOUR DOCTOR. ° °1. A prescription for pain medication may be given to you upon discharge.  Take your pain medication as prescribed, if needed.  If narcotic pain medicine is not needed, then you may take acetaminophen (Tylenol) or ibuprofen (Advil) as needed. °2. Take your usually prescribed medications unless otherwise directed °3. If you need a refill on your pain medication, please contact your pharmacy.  They will contact our office to request authorization.  Prescriptions will not be filled after 5pm or on week-ends. °4. You should eat very light the first 24 hours after surgery, such as soup, crackers, pudding, etc.  Resume your normal diet the day after surgery. °5. Most patients will experience some swelling and bruising in the breast.  Ice packs and a good support bra will help.  Swelling and bruising can take several days to resolve.  °6. It is common to experience some constipation if taking pain medication after surgery.  Increasing fluid intake and taking a stool softener will usually help or prevent this problem from occurring.  A mild laxative (Milk of Magnesia or Miralax) should be taken according to package directions if there are no bowel movements after 48 hours. °7. Unless discharge instructions indicate otherwise, you may remove your bandages 24-48 hours after surgery, and you may shower at that time.  You may have steri-strips (small skin tapes) in place directly over the incision.  These strips should be left on the skin for 7-10 days.  If your surgeon used skin glue on the incision, you may shower in 24 hours.  The glue will flake off over the  next 2-3 weeks.  Any sutures or staples will be removed at the office during your follow-up visit. °8. ACTIVITIES:  You may resume regular daily activities (gradually increasing) beginning the next day.  Wearing a good support bra or sports bra minimizes pain and swelling.  You may have sexual intercourse when it is comfortable. °a. You may drive when you no longer are taking prescription pain medication, you can comfortably wear a seatbelt, and you can safely maneuver your car and apply brakes. °b. RETURN TO WORK:  ______________________________________________________________________________________ °9. You should see your doctor in the office for a follow-up appointment approximately two weeks after your surgery.  Your doctor’s nurse will typically make your follow-up appointment when she calls you with your pathology report.  Expect your pathology report 2-3 business days after your surgery.  You may call to check if you do not hear from us after three days. °10. OTHER INSTRUCTIONS: _______________________________________________________________________________________________ _____________________________________________________________________________________________________________________________________ °_____________________________________________________________________________________________________________________________________ °_____________________________________________________________________________________________________________________________________ ° °WHEN TO CALL YOUR DOCTOR: °1. Fever over 101.0 °2. Nausea and/or vomiting. °3. Extreme swelling or bruising. °4. Continued bleeding from incision. °5. Increased pain, redness, or drainage from the incision. ° °The clinic staff is available to answer your questions during regular business hours.  Please don’t hesitate to call and ask to speak to one of the nurses for clinical concerns.  If you have a medical emergency, go to the nearest  emergency room or call 911.  A surgeon from [facility] is always on call at the hospital. ° °For further questions, please visit [email or web address]  ° ° ° ° °  Post Anesthesia Home Care Instructions ° °Activity: °Get plenty of rest for the remainder of the day. A responsible individual must stay with you for 24 hours following the procedure.  °For the next 24 hours, DO NOT: °-Drive a car °-Operate machinery °-Drink alcoholic beverages °-Take any medication unless instructed by your physician °-Make any legal decisions or sign important papers. ° °Meals: °Start with liquid foods such as gelatin or soup. Progress to regular foods as tolerated. Avoid greasy, spicy, heavy foods. If nausea and/or vomiting occur, drink only clear liquids until the nausea and/or vomiting subsides. Call your physician if vomiting continues. ° °Special Instructions/Symptoms: °Your throat may feel dry or sore from the anesthesia or the breathing tube placed in your throat during surgery. If this causes discomfort, gargle with warm salt water. The discomfort should disappear within 24 hours. ° °If you had a scopolamine patch placed behind your ear for the management of post- operative nausea and/or vomiting: ° °1. The medication in the patch is effective for 72 hours, after which it should be removed.  Wrap patch in a tissue and discard in the trash. Wash hands thoroughly with soap and water. °2. You may remove the patch earlier than 72 hours if you experience unpleasant side effects which may include dry mouth, dizziness or visual disturbances. °3. Avoid touching the patch. Wash your hands with soap and water after contact with the patch. °  ° °

## 2018-06-10 NOTE — Transfer of Care (Signed)
Immediate Anesthesia Transfer of Care Note  Patient: Robin Boyd  Procedure(s) Performed: BREAST LUMPECTOMY X2 WITH RADIOACTIVE SEED LOCALIZATION X2 (Right Breast)  Patient Location: PACU  Anesthesia Type:General  Level of Consciousness: awake, alert , oriented and patient cooperative  Airway & Oxygen Therapy: Patient Spontanous Breathing and Patient connected to face mask oxygen  Post-op Assessment: Report given to RN and Post -op Vital signs reviewed and stable  Post vital signs: Reviewed and stable  Last Vitals:  Vitals Value Taken Time  BP 141/72 06/10/2018  8:17 AM  Temp    Pulse 111 06/10/2018  8:18 AM  Resp    SpO2 96 % 06/10/2018  8:18 AM  Vitals shown include unvalidated device data.  Last Pain:  Vitals:   06/10/18 0644  TempSrc: Oral  PainSc: 0-No pain         Complications: No apparent anesthesia complications

## 2018-06-10 NOTE — Op Note (Signed)
Patient Name:           Robin Boyd   Date of Surgery:        06/10/2018  Pre op Diagnosis:      High-grade DCIS right breast, multifocal, receptor positive                                       History bilateral subpectoral breast implants  Post op Diagnosis:    Same  Procedure:                 Right breast lumpectomy x2 with radioactive active seed localization x2  Surgeon:                     Edsel Petrin. Dalbert Batman, M.D., FACS  Assistant:                      Carlena Hurl, [place]  Operative Indications:   This is a very pleasant 62 year old Hispanic female from [place].  She is seen in the [facility] by Dr. Burr Medico, Dr. Lisbeth Renshaw, and me. Her primary physician is Dr. Lennette Bihari Via. She is also followed by [facility], Elon Alas, NP for gynecology      The only prior breast issues are bilateral augmentation mammoplasty 10 years ago in [place]. She does not know the technique but thinks this is silicone and thinks these are subpectoral. Last mammograms 2 years ago. Recent mammogram showed 2 small areas of calcifications in the right breast. Category B density. Both of these were biopsied. These are close but not involving the capsule of the implant. 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 PR 0 A second area of calcifications, also at the 12:30 position, 4 cm from the nipple also shows high-grade DCIS. Insufficient for breast diagnostic profile Family history is negative for breast, ovarian, prostate, or colon cancer mother living with hypertension. Father's health history is unknown      We had a very long discussion almost 1 hour to discuss options and surgical management. We talked about lumpectomy with double radioactive seed localization, whole breast radiation therapy and antiestrogen therapy. We compared that to mastectomy, sentinel node,  ultimate reconstruction. I told her I did not see any survival advantage with mastectomy. I told her my bias was the lumpectomy. She is strongly motivated for breast conservation I think she is a reasonable candidate for that. We had a long talk about this. She knows that the 2 areas are close to the implant and I drew pictures to describe it to her and her husband. She does that there is a higher risk for positive margin. Small but definite risk of implant injury and removal of the implant requiring reconstruction later. She has is a possibility that there is invasive cancer but hopefully not. She'll be scheduled for right breast lumpectomy 2 with radioactive seed localization 2. She agrees with this plan.  Operative Findings:       The 2 radioactive seeds were well placed.  These were both in the 12th 30 position of the right breast, immediately adjacent to the original biopsy clips.  The specimen mammogram looked good with the clips and seeds in the center of the specimen  and it looked like I had a good margin from a radiographic standpoint.  The broad posterior margin is the anterior surface of the pectoralis muscle.  The broad anterior margin is the skin.  Procedure in Detail:          Following the induction of general LMA anesthesia the patient's right breast was prepped and draped in a sterile fashion.  Intravenous antibiotics were given.  Surgical timeout was performed.  0.5% Marcaine with epinephrine was used as local infiltration anesthetic, but this was injected very superficially just into the skin and superficial subcutaneous tissue.  Using the neoprobe I mapped out the 2 seeds.  They were only 2 or 3 cm away from each other.  This allowed a superiorly placed, transverse, curvilinear incision which parallel the areolar margin but was probably 4 to 5 cm above the areolar margin.  The incision was made with the knife.  The rest of the dissection was performed using electrocautery and the  neoprobe.   I was able to get around both radioactive seeds but I had to take it all the way down to the pectoralis muscle.  The specimen was removed and marked with silk sutures and a 6 color ink kit.  Specimen mammogram looked very good.  The specimen was sent to the lab where the seeds were retrieved.  The wound was irrigated.  I placed 4 metal marker clips in the periphery of the lumpectomy cavity but did not place any clips deep hoping to avoid any injury to the implant.  The breast tissues were reapproximated with interrupted sutures of 3-0 Vicryl and the skin closed with a running subcuticular 4-0 Monocryl and Dermabond.  Cosmetically this looked good with minimal defect or volume loss.  Dry bandages and a breast binder were placed.  The patient tolerated the procedure well and was taken to PACU in stable condition.  EBL 10 cc.  Counts correct.  Complications none.    Addendum: I logged onto the [employer] and reviewed her prescription medication history.     Edsel Petrin. Dalbert Batman, M.D., FACS General and Minimally Invasive Surgery Breast and Colorectal Surgery  06/10/2018 8:14 AM

## 2018-06-10 NOTE — Anesthesia Postprocedure Evaluation (Signed)
Anesthesia Post Note  Patient: Robin Boyd  Procedure(s) Performed: BREAST LUMPECTOMY X2 WITH RADIOACTIVE SEED LOCALIZATION X2 (Right Breast)     Patient location during evaluation: PACU Anesthesia Type: General Level of consciousness: awake and alert and oriented Pain management: pain level controlled Vital Signs Assessment: post-procedure vital signs reviewed and stable Respiratory status: spontaneous breathing, nonlabored ventilation and respiratory function stable Cardiovascular status: blood pressure returned to baseline and stable Postop Assessment: no apparent nausea or vomiting Anesthetic complications: no    Last Vitals:  Vitals:   06/10/18 0819 06/10/18 0830  BP:  104/66  Pulse: (!) 112 90  Resp: 11 14  Temp: 36.5 C   SpO2: 100% 100%    Last Pain:  Vitals:   06/10/18 0644  TempSrc: Oral  PainSc: 0-No pain                 Sacred Roa A.

## 2018-06-11 ENCOUNTER — Encounter (HOSPITAL_BASED_OUTPATIENT_CLINIC_OR_DEPARTMENT_OTHER): Payer: Self-pay | Admitting: General Surgery

## 2018-06-12 NOTE — Progress Notes (Signed)
Inform patient of Pathology report,. Her breast pathology shows two foci of DCIS, as expected. Margins are negative. She will not need any more surgery. I will discuss in detail at next OV. Be sure she has appt. 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 Let me know you reached her.  Dalbert Batman

## 2018-06-13 ENCOUNTER — Other Ambulatory Visit: Payer: Self-pay | Admitting: General Surgery

## 2018-06-13 DIAGNOSIS — C50811 Malignant neoplasm of overlapping sites of right female breast: Secondary | ICD-10-CM

## 2018-07-02 NOTE — Progress Notes (Signed)
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 - NO EVIDENCE OF INVASION. - THE ANTERIOR MARGIN IS 3.5 MILLIMETERS AND THE SUPERIOR MARGIN IS 6 MILLIMETERS FROM DCIS; OTHER MARGINS ARE GREATER THAN 1 CM. - BIOPSY SITE CHANGES. - SEE ONCOLOGY TABLE. Microscopic Comment DCIS OF THE BREAST: Resection Procedure: Lumpectomy.  Receptor Status:Estrogen Receptor: 50%, strong staining intensity. Progesterone Receptor: 0%, negative. 05/16/18 1. PROGNOSTIC INDICATORS Results: IMMUNOHISTOCHEMICAL AND MORPHOMETRIC ANALYSIS PERFORMED MANUALLY Estrogen Receptor: 50%, POSITIVE, STRONG STAINING INTENSITY Progesterone Receptor: 0%, NEGATIVE Diagnosis 1. 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 2. Breast, right, needle core biopsy, 12:30 o'clock, 4cm from nipple - FOCAL HIGH GRADE DUCTAL CARCINOMA IN SITU.  Did patient present with symptoms (if so, please note symptoms) or was this found on screening mammography   06/10/18/ Dr. Dalbert Batman BREAST LUMPECTOMY X2 WITH RADIOACTIVE SEED LOCALIZATION X2  Past/Anticipated interventions by medical oncology, if any: Chemotherapy  Lymphedema issues, if any:  None  Pain issues, if any:  None  SAFETY ISSUES:  Prior radiation?  None  Pacemaker/ICD? No  Possible current pregnancy? No  Is the patient on methotrexate? No  Current Complaints / other details:   Vitals:   07/04/18 1302  BP: 122/87  Pulse: 71  Resp: 18  Temp: (!) 97.5 F (36.4 C)  TempSrc: Oral  SpO2: 100%  Weight: 151 lb 9.6 oz (68.8 kg)   Wt Readings from Last 3 Encounters:  07/04/18 151 lb 9.6 oz (68.8 kg)  06/10/18 150 lb 12.7 oz (68.4 kg)  05/22/18 151 lb 12.8 oz (68.9 kg)        Mardene Sayer, LPN 01/30/7105,26:94 AM  .

## 2018-07-04 ENCOUNTER — Encounter: Payer: Self-pay | Admitting: Radiation Oncology

## 2018-07-04 ENCOUNTER — Ambulatory Visit
Admission: RE | Admit: 2018-07-04 | Discharge: 2018-07-04 | Disposition: A | Discharge status: Home / Self Care | Payer: BLUE CROSS/BLUE SHIELD | Source: Ambulatory Visit | Attending: Radiation Oncology | Admitting: Radiation Oncology

## 2018-07-04 ENCOUNTER — Other Ambulatory Visit: Payer: Self-pay

## 2018-07-04 VITALS — BP 122/87 | HR 71 | Temp 97.5°F | Resp 18 | Wt 151.6 lb

## 2018-07-04 DIAGNOSIS — Z170 Estrogen receptor positive status [ER+]: Secondary | ICD-10-CM | POA: Diagnosis not present

## 2018-07-04 DIAGNOSIS — Z510 Encounter for antineoplastic radiation therapy: Secondary | ICD-10-CM | POA: Diagnosis not present

## 2018-07-04 DIAGNOSIS — Z79899 Other long term (current) drug therapy: Secondary | ICD-10-CM | POA: Insufficient documentation

## 2018-07-04 DIAGNOSIS — D0511 Intraductal carcinoma in situ of right breast: Secondary | ICD-10-CM

## 2018-07-04 DIAGNOSIS — Z9889 Other specified postprocedural states: Secondary | ICD-10-CM | POA: Diagnosis not present

## 2018-07-04 NOTE — Progress Notes (Deleted)
Radiation Oncology         (336) [phone] ________________________________  Name: Robin Boyd        MRN: [id number]  Date of Service: 07/04/2018 DOB: 25-Sep-1955  CC:Via, Lennette Bihari, MD  Truitt Merle, MD     REFERRING PHYSICIAN: Truitt Merle, MD   DIAGNOSIS: The encounter diagnosis was Ductal carcinoma in situ (DCIS) of right breast.   HISTORY OF PRESENT ILLNESS: Robin Boyd is a 62 y.o. female seen in the multidisciplinary breast clinic for a new diagnosis of right breast cancer. The patient was noted to have 2 areas of calcification sin the right breast that led to additional diagnostic imaging which revealed 2 masses with calcifications in the superior aspect of the right breast. At 12:30 there was a 9 x 9 x 7 mm mass as well as a 5 x 4 x 4 mm area also at 12:30. She had an axillary ultrasound that was negative for adenopathy. She underwent a biopsy of both sites on 05/16/18 that revealed a high grade DCIS with calcifications, ER positive, PR negative. She comes today to discuss treatment recommendations for her cancer.    PREVIOUS RADIATION THERAPY: No   PAST MEDICAL HISTORY:  Past Medical History:  Diagnosis Date  . Depression   . Hyperlipidemia        PAST SURGICAL HISTORY: Past Surgical History:  Procedure Laterality Date  . ABDOMINAL HYSTERECTOMY  2012   ABDOMINAL HYSTERECTOMY WITH OVARIAN PRESERVATION - [place]   . AUGMENTATION MAMMAPLASTY  4008,    SILICONE  . BREAST LUMPECTOMY WITH RADIOACTIVE SEED LOCALIZATION Right 06/10/2018   Procedure: BREAST LUMPECTOMY X2 WITH RADIOACTIVE SEED LOCALIZATION X2;  Surgeon: Fanny Skates, MD;  Location: [facility];  Service: General;  Laterality: Right;  . BREAST SURGERY    . TUBAL LIGATION       FAMILY HISTORY: No family history on file.   SOCIAL HISTORY:  reports that she has never smoked. She has never used smokeless tobacco. She reports that she drank alcohol. She reports that she does not use drugs. The  patient is married and originally from [place]. 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 Her husband works for [employer] and his job relocated them to the [place] about 5 years ago. She has children who live in [place].   ALLERGIES: Patient has no known allergies.   MEDICATIONS:  Current Outpatient Medications  Medication Sig Dispense Refill  . atorvastatin (LIPITOR) 40 MG tablet     . venlafaxine XR (EFFEXOR-XR) 75 MG 24 hr capsule Take 1 capsule (75 mg total) by mouth daily with breakfast. 30 capsule 2  . HYDROcodone-acetaminophen (NORCO) 5-325 MG tablet Take 1-2 tablets by mouth every 6 (six) hours as needed for moderate pain or severe pain. (Patient not taking: Reported on 07/04/2018) 20 tablet 0   No current facility-administered medications for this encounter.      REVIEW OF SYSTEMS: On review of systems, the patient reports that she is doing well overall. She denies any chest pain, shortness of breath, cough, fevers, chills, night sweats, unintended weight changes. She denies any bowel or bladder disturbances, and denies abdominal pain, nausea or vomiting. She denies any new musculoskeletal or joint aches or pains. A complete review of systems is obtained and is otherwise negative.     PHYSICAL EXAM:  Wt Readings from Last 3 Encounters:  07/04/18 151 lb 9.6 oz (68.8 kg)  06/10/18 150 lb 12.7 oz (  68.4 kg)  05/22/18 151 lb 12.8 oz (68.9 kg)   Temp Readings from Last 3 Encounters:  07/04/18 (!) 97.5 F (36.4 C) (Oral)  06/10/18 98.6 F (37 C)  05/22/18 98.2 F (36.8 C) (Oral)   BP Readings from Last 3 Encounters:  07/04/18 122/87  06/10/18 118/84  05/22/18 140/79   Pulse Readings from Last 3 Encounters:  07/04/18 71  06/10/18 84  05/22/18 81     In general this is a well appearing Hispanic female in no acute distress. She is alert and oriented x4 and appropriate throughout the examination.  HEENT reveals that the patient is normocephalic, atraumatic. EOMs are intact.  Skin is intact without any evidence of gross lesions. Cardiopulmonary assessment is negative for acute distress and she exhibits normal effort. Her    ECOG = 0  0 - Asymptomatic (Fully active, able to carry on all predisease activities without restriction)  1 - Symptomatic but completely ambulatory (Restricted in physically strenuous activity but ambulatory and able to carry out work of a light or sedentary nature. For example, light housework, office work)  2 - Symptomatic, <50% in bed during the day (Ambulatory and capable of all self care but unable to carry out any work activities. Up and about more than 50% of waking hours)  3 - Symptomatic, >50% in bed, but not bedbound (Capable of only limited self-care, confined to bed or chair 50% or more of waking hours)  4 - Bedbound (Completely disabled. Cannot carry on any self-care. Totally confined to bed or chair)  5 - Death   Eustace Pen MM, Creech RH, Tormey DC, et al. [id number]). "Toxicity and response criteria of the [facility]". [place] Oncol. 5 (6): 649-55    LABORATORY DATA:  Lab Results  Component Value Date   WBC 6.1 05/22/2018   HGB 13.4 05/22/2018   HCT 42.1 05/22/2018   MCV 88.3 05/22/2018   PLT 253 05/22/2018   Lab Results  Component Value Date   NA 143 05/22/2018   K 4.1 05/22/2018   CL 106 05/22/2018   CO2 25 05/22/2018   Lab Results  Component Value Date   ALT 16 05/22/2018   AST 19 05/22/2018   ALKPHOS 48 05/22/2018   BILITOT 0.5 05/22/2018      RADIOGRAPHY: Mm Breast Surgical Specimen  Result Date: 06/13/2018 CLINICAL DATA:  Patient underwent 2 radioactive seed localizations of the right breast prior to lumpectomy for ductal carcinoma in situ. EXAM: SPECIMEN RADIOGRAPH OF THE RIGHT BREAST COMPARISON:  Previous exam(s). FINDINGS: Status post excision of the right breast. Two radioactive seeds, a coil shaped  biopsy clip, and a ribbon shaped biopsy clip are present, completely intact, and were marked for pathology. Numerous calcifications are present within specimen. IMPRESSION: Specimen radiograph of the right breast. Electronically Signed   By: Curlene Dolphin M.D.   On: 06/10/2018 08:08   Mm Rt Radioactive Seed Loc Mammo Guide  Result Date: 06/07/2018 CLINICAL DATA:  62 year old female with 2 sites of biopsy-proven high-grade ductal carcinoma in situ in the right breast. EXAM: MAMMOGRAPHIC GUIDED RADIOACTIVE SEED LOCALIZATION OF THE right BREAST COMPARISON:  Previous exam(s). FINDINGS: Patient presents for radioactive seed localization prior to right breast lumpectomy. I met with the patient and we discussed the procedure of seed localization including benefits and alternatives. We discussed the high likelihood of a successful procedure. We discussed the risks of the procedure including infection, bleeding, tissue injury and further surgery. We discussed the low dose  of radioactivity involved in the procedure. Informed, written consent was given. The usual time-out protocol was performed immediately prior to the procedure. Using mammographic guidance, sterile technique, 1% lidocaine and an I-125 radioactive seed, the ribbon shaped clip in the upper inner quadrant of the right breast at posterior depth was localized using a medial approach. Order number of I-125 seed:  [id number]. Total activity:  6.269 millicurie reference Date: 06/05/2018 Subsequently, using mammographic guidance, sterile technique, 1% lidocaine and an I-125 radioactive seed, the coil shaped clip in the upper inner quadrant of the right breast at middle depth was localized using a medial approach. Order number of I-125 seed:  [id number]. Total activity:  5.009 millicurie reference Date: 06/05/2018 The follow-up mammogram images confirm the seeds in the expected locations and were marked for Dr. Dalbert Batman. Follow-up survey of the patient confirms  presence of the radioactive seeds. The patient tolerated the procedure well and was released from the Breast Center. She was given instructions regarding seed removal. IMPRESSION: Bracketed radioactive seed localization right breast. No apparent complications. This procedure was performed under the direct supervision of Authorized User Dr. Lovey Newcomer, MD. Electronically Signed   By: Kristopher Oppenheim M.D.   On: 06/07/2018 13:45   Mm Rt Radio Seed Ea Add Lesion Loc Mammo  Result Date: 06/07/2018 CLINICAL DATA:  62 year old female with 2 sites of biopsy-proven high-grade ductal carcinoma in situ in the right breast. EXAM: MAMMOGRAPHIC GUIDED RADIOACTIVE SEED LOCALIZATION OF THE right BREAST COMPARISON:  Previous exam(s). FINDINGS: Patient presents for radioactive seed localization prior to right breast lumpectomy. I met with the patient and we discussed the procedure of seed localization including benefits and alternatives. We discussed the high likelihood of a successful procedure. We discussed the risks of the procedure including infection, bleeding, tissue injury and further surgery. We discussed the low dose of radioactivity involved in the procedure. Informed, written consent was given. The usual time-out protocol was performed immediately prior to the procedure. Using mammographic guidance, sterile technique, 1% lidocaine and an I-125 radioactive seed, the ribbon shaped clip in the upper inner quadrant of the right breast at posterior depth was localized using a medial approach. Order number of I-125 seed:  [id number]. Total activity:  1.696 millicurie reference Date: 06/05/2018 Subsequently, using mammographic guidance, sterile technique, 1% lidocaine and an I-125 radioactive seed, the coil shaped clip in the upper inner quadrant of the right breast at middle depth was localized using a medial approach. Order number of I-125 seed:  [id number]. Total activity:  5.102 millicurie reference Date: 06/05/2018 The  follow-up mammogram images confirm the seeds in the expected locations and were marked for Dr. Dalbert Batman. Follow-up survey of the patient confirms presence of the radioactive seeds. The patient tolerated the procedure well and was released from the Breast Center. She was given instructions regarding seed removal. IMPRESSION: Bracketed radioactive seed localization right breast. No apparent complications. This procedure was performed under the direct supervision of Authorized User Dr. Lovey Newcomer, MD. Electronically Signed   By: Kristopher Oppenheim M.D.   On: 06/07/2018 13:45       IMPRESSION/PLAN: 1. High Grade ER positive DCIS of the right breast. We discussed the final pathology findings and reviewed the nature of noninvasive breast disease. She has done well since completing surgery. She would also be benefited by adjuvant antiestrogen therapy. We reviewed the risks, benefits, short, and long term effects of therapy as well as the delivery and logistics of radiotherapy and Dr. Lisbeth Renshaw has recommended a course of  6 1/2 weeks of radiotherapy given her in situ implants. Written consent is obtained and placed in the chart, a copy was provided to the patient.  In a visit lasting 45 minutes, greater than 50% of the time was spent face to face discussing her case, and coordinating the patient's care.  The above documentation reflects my direct findings during this shared patient visit. Please see the separate note by Dr. Lisbeth Renshaw on this date for the remainder of the patient's plan of care.    Carola Rhine, PAC

## 2018-07-04 NOTE — Progress Notes (Signed)
  Radiation Oncology         (336) [phone] ________________________________  Name: Zakiyah Diop MRN: [id number]  Date: 07/04/2018  DOB: 1956/05/07  Optical Surface Tracking Plan:  Since intensity modulated radiotherapy (IMRT) and 3D conformal radiation treatment methods are predicated on accurate and precise positioning for treatment, intrafraction motion monitoring is medically necessary to ensure accurate and safe treatment delivery.  The ability to quantify intrafraction motion without excessive ionizing radiation dose can only be performed with optical surface tracking. Accordingly, surface imaging offers the opportunity to obtain 3D measurements of patient position throughout IMRT and 3D treatments without excessive radiation exposure.  I am ordering optical surface tracking for this patient's upcoming course of radiotherapy. ________________________________  Kyung Rudd, MD 07/04/2018 4:20 PM    Reference:   Ursula Alert, J, et al. Surface imaging-based analysis of intrafraction motion for breast radiotherapy patients.Journal of [place], n. 6, nov. 2014. ISSN [id number].   Available at: <http://www.jacmp.org/index.php/jacmp/article/view/4957>.

## 2018-07-04 NOTE — Progress Notes (Signed)
Radiation Oncology         [phone] ________________________________  Name: Robin Boyd        MRN: [id number]  Date of Service: 07/04/2018 DOB: 1955-08-25  CC:Via, Lennette Bihari, MD  Truitt Merle, MD     REFERRING PHYSICIAN: Truitt Merle, MD   DIAGNOSIS: The encounter diagnosis was Ductal carcinoma in situ (DCIS) of right breast.   HISTORY OF PRESENT ILLNESS: Robin Boyd is a 62 y.o. female originally seen in the multidisciplinary breast clinic for a new diagnosis of right breast cancer. The patient was noted to have 2 areas of calcification sin the right breast that led to additional diagnostic imaging which revealed 2 masses with calcifications in the superior aspect of the right breast. At 12:30 there was a 9 x 9 x 7 mm mass as well as a 5 x 4 x 4 mm area also at 12:30. She had an axillary ultrasound that was negative for adenopathy. She underwent a biopsy of both sites on 05/16/18 that revealed a high grade DCIS with calcifications, ER positive, PR negative. She underwent lumpectomy x 2 on 06/10/18 which revealed 2 foci of high grade DCIS measuring 8 mm and 10 mm. She had negative margins and comes today to discuss options of adjuvant radiotherapy.   PREVIOUS RADIATION THERAPY: No   PAST MEDICAL HISTORY:  Past Medical History:  Diagnosis Date  . Depression   . Hyperlipidemia        PAST SURGICAL HISTORY: Past Surgical History:  Procedure Laterality Date  . ABDOMINAL HYSTERECTOMY  2012   ABDOMINAL HYSTERECTOMY WITH OVARIAN PRESERVATION - [place]   . AUGMENTATION MAMMAPLASTY  4627,    SILICONE  . BREAST LUMPECTOMY WITH RADIOACTIVE SEED LOCALIZATION Right 06/10/2018   Procedure: BREAST LUMPECTOMY X2 WITH RADIOACTIVE SEED LOCALIZATION X2;  Surgeon: Fanny Skates, MD;  Location: [facility];  Service: General;  Laterality: Right;  . BREAST SURGERY    . TUBAL LIGATION       FAMILY HISTORY: No family history on file.   SOCIAL HISTORY:  reports that she has  never smoked. She has never used smokeless tobacco. She reports that she drank alcohol. She reports that she does not use drugs. The patient is married and originally from [place]. 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 Her husband works for [employer] and his job relocated them to the [place] about 5 years ago. She has children who live in [place] and is taking care of her grandchildren as well.   ALLERGIES: Patient has no known allergies.   MEDICATIONS:  Current Outpatient Medications  Medication Sig Dispense Refill  . atorvastatin (LIPITOR) 40 MG tablet     . venlafaxine XR (EFFEXOR-XR) 75 MG 24 hr capsule Take 1 capsule (75 mg total) by mouth daily with breakfast. 30 capsule 2  . HYDROcodone-acetaminophen (NORCO) 5-325 MG tablet Take 1-2 tablets by mouth every 6 (six) hours as needed for moderate pain or severe pain. (Patient not taking: Reported on 07/04/2018) 20 tablet 0   No current facility-administered medications for this encounter.      REVIEW OF SYSTEMS: On review of systems, the patient reports that she is doing well overall. She denies any chest pain, shortness of breath, cough, fevers, chills, night sweats, unintended weight changes. She denies any bowel or bladder disturbances, and denies abdominal pain, nausea or vomiting. She denies any new musculoskeletal or joint aches or pains. A complete review of systems  is obtained and is otherwise negative.     PHYSICAL EXAM:  Wt Readings from Last 3 Encounters:  07/04/18 151 lb 9.6 oz (68.8 kg)  06/10/18 150 lb 12.7 oz (68.4 kg)  05/22/18 151 lb 12.8 oz (68.9 kg)   Temp Readings from Last 3 Encounters:  07/04/18 (!) 97.5 F (36.4 C) (Oral)  06/10/18 98.6 F (37 C)  05/22/18 98.2 F (36.8 C) (Oral)   BP Readings from Last 3 Encounters:  07/04/18 122/87  06/10/18 118/84  05/22/18 140/79   Pulse Readings from Last 3 Encounters:  07/04/18 71    06/10/18 84  05/22/18 81     In general this is a well appearing Hispanic female in no acute distress. She is alert and oriented x4 and appropriate throughout the examination. HEENT reveals that the patient is normocephalic, atraumatic. EOMs are intact.  Skin is intact without any evidence of gross lesions. Cardiopulmonary assessment is negative for acute distress and she exhibits normal effort. Her right breast is examined. An in situ implant is noted and she has a well healed lumpectomy incision site. No erythema is noted.   ECOG = 0  0 - Asymptomatic (Fully active, able to carry on all predisease activities without restriction)  1 - Symptomatic but completely ambulatory (Restricted in physically strenuous activity but ambulatory and able to carry out work of a light or sedentary nature. For example, light housework, office work)  2 - Symptomatic, <50% in bed during the day (Ambulatory and capable of all self care but unable to carry out any work activities. Up and about more than 50% of waking hours)  3 - Symptomatic, >50% in bed, but not bedbound (Capable of only limited self-care, confined to bed or chair 50% or more of waking hours)  4 - Bedbound (Completely disabled. Cannot carry on any self-care. Totally confined to bed or chair)  5 - Death   Eustace Pen MM, Creech RH, Tormey DC, et al. [phone]). "Toxicity and response criteria of the [facility]". [place] Oncol. 5 (6): 649-55    LABORATORY DATA:  Lab Results  Component Value Date   WBC 6.1 05/22/2018   HGB 13.4 05/22/2018   HCT 42.1 05/22/2018   MCV 88.3 05/22/2018   PLT 253 05/22/2018   Lab Results  Component Value Date   NA 143 05/22/2018   K 4.1 05/22/2018   CL 106 05/22/2018   CO2 25 05/22/2018   Lab Results  Component Value Date   ALT 16 05/22/2018   AST 19 05/22/2018   ALKPHOS 48 05/22/2018   BILITOT 0.5 05/22/2018      RADIOGRAPHY: Mm Breast Surgical Specimen  Result Date:  06/13/2018 CLINICAL DATA:  Patient underwent 2 radioactive seed localizations of the right breast prior to lumpectomy for ductal carcinoma in situ. EXAM: SPECIMEN RADIOGRAPH OF THE RIGHT BREAST COMPARISON:  Previous exam(s). FINDINGS: Status post excision of the right breast. Two radioactive seeds, a coil shaped biopsy clip, and a ribbon shaped biopsy clip are present, completely intact, and were marked for pathology. Numerous calcifications are present within specimen. IMPRESSION: Specimen radiograph of the right breast. Electronically Signed   By: Curlene Dolphin M.D.   On: 06/10/2018 08:08   Mm Rt Radioactive Seed Loc Mammo Guide  Result Date: 06/07/2018 CLINICAL DATA:  62 year old female with 2 sites of biopsy-proven high-grade ductal carcinoma in situ in the right breast. EXAM: MAMMOGRAPHIC GUIDED RADIOACTIVE SEED LOCALIZATION OF THE right BREAST COMPARISON:  Previous exam(s). FINDINGS: Patient  presents for radioactive seed localization prior to right breast lumpectomy. I met with the patient and we discussed the procedure of seed localization including benefits and alternatives. We discussed the high likelihood of a successful procedure. We discussed the risks of the procedure including infection, bleeding, tissue injury and further surgery. We discussed the low dose of radioactivity involved in the procedure. Informed, written consent was given. The usual time-out protocol was performed immediately prior to the procedure. Using mammographic guidance, sterile technique, 1% lidocaine and an I-125 radioactive seed, the ribbon shaped clip in the upper inner quadrant of the right breast at posterior depth was localized using a medial approach. Order number of I-125 seed:  [id number]. Total activity:  6.203 millicurie reference Date: 06/05/2018 Subsequently, using mammographic guidance, sterile technique, 1% lidocaine and an I-125 radioactive seed, the coil shaped clip in the upper inner quadrant of the right  breast at middle depth was localized using a medial approach. Order number of I-125 seed:  [id number]. Total activity:  4.536 millicurie reference Date: 06/05/2018 The follow-up mammogram images confirm the seeds in the expected locations and were marked for Dr. Dalbert Batman. Follow-up survey of the patient confirms presence of the radioactive seeds. The patient tolerated the procedure well and was released from the Breast Center. She was given instructions regarding seed removal. IMPRESSION: Bracketed radioactive seed localization right breast. No apparent complications. This procedure was performed under the direct supervision of Authorized User Dr. Lovey Newcomer, MD. Electronically Signed   By: Kristopher Oppenheim M.D.   On: 06/07/2018 13:45   Mm Rt Radio Seed Ea Add Lesion Loc Mammo  Result Date: 06/07/2018 CLINICAL DATA:  62 year old female with 2 sites of biopsy-proven high-grade ductal carcinoma in situ in the right breast. EXAM: MAMMOGRAPHIC GUIDED RADIOACTIVE SEED LOCALIZATION OF THE right BREAST COMPARISON:  Previous exam(s). FINDINGS: Patient presents for radioactive seed localization prior to right breast lumpectomy. I met with the patient and we discussed the procedure of seed localization including benefits and alternatives. We discussed the high likelihood of a successful procedure. We discussed the risks of the procedure including infection, bleeding, tissue injury and further surgery. We discussed the low dose of radioactivity involved in the procedure. Informed, written consent was given. The usual time-out protocol was performed immediately prior to the procedure. Using mammographic guidance, sterile technique, 1% lidocaine and an I-125 radioactive seed, the ribbon shaped clip in the upper inner quadrant of the right breast at posterior depth was localized using a medial approach. Order number of I-125 seed:  [id number]. Total activity:  4.825 millicurie reference Date: 06/05/2018 Subsequently, using  mammographic guidance, sterile technique, 1% lidocaine and an I-125 radioactive seed, the coil shaped clip in the upper inner quadrant of the right breast at middle depth was localized using a medial approach. Order number of I-125 seed:  [id number]. Total activity:  9.169 millicurie reference Date: 06/05/2018 The follow-up mammogram images confirm the seeds in the expected locations and were marked for Dr. Dalbert Batman. Follow-up survey of the patient confirms presence of the radioactive seeds. The patient tolerated the procedure well and was released from the Breast Center. She was given instructions regarding seed removal. IMPRESSION: Bracketed radioactive seed localization right breast. No apparent complications. This procedure was performed under the direct supervision of Authorized User Dr. Lovey Newcomer, MD. Electronically Signed   By: Kristopher Oppenheim M.D.   On: 06/07/2018 13:45       IMPRESSION/PLAN: 1. High Grade ER positive DCIS of the right breast. We discussed the  final pathology findings and reviewed the nature of noninvasive breast disease. She has done well since completing surgery. She would be benefited by adjuvant radiotherapy and antiestrogen therapy. We reviewed the risks, benefits, short, and long term effects of therapy as well as the delivery and logistics of radiotherapy and Dr. Lisbeth Renshaw has recommended a course of 6 1/2 weeks of radiotherapy given her in situ implants. She will simulate today following her visit. Written consent is obtained and placed in the chart, a copy was provided to the patient.  In a visit lasting 25 minutes, greater than 50% of the time was spent face to face discussing her case, and coordinating the patient's care.     Carola Rhine, PAC

## 2018-07-04 NOTE — Progress Notes (Signed)
  Radiation Oncology         [phone] ________________________________  Name: Robin Boyd MRN: [id number]  Date: 07/04/2018  DOB: [birth year]  DIAGNOSIS:     ICD-10-CM   1. Ductal carcinoma in situ (DCIS) of right breast D05.11      SIMULATION AND TREATMENT PLANNING NOTE  The patient presented for simulation prior to beginning her course of radiation treatment for her diagnosis of right-sided breast cancer. The patient was placed in a supine position on a breast board. A customized vac-lock bag was constructed and this complex treatment device will be used on a daily basis during her treatment. In this fashion, a CT scan was obtained through the chest area and an isocenter was placed near the chest wall within the breast.  The patient will be planned to receive a course of radiation initially to a dose of 50.4 Gy. This will consist of a whole breast radiotherapy technique. To accomplish this, 2 customized blocks have been designed which will correspond to medial and lateral whole breast tangent fields. This treatment will be accomplished at 1.8 Gy per fraction. A forward planning technique will also be evaluated to determine if this approach improves the plan. It is anticipated that the patient will then receive a 10 Gy boost to the seroma cavity which has been contoured. This will be accomplished at 2 Gy per fraction.   This initial treatment will consist of a 3-D conformal technique. The seroma has been contoured as the primary target structure. Additionally, dose volume histograms of both this target as well as the lungs and heart will also be evaluated. Such an approach is necessary to ensure that the target area is adequately covered while the nearby critical  normal structures are adequately spared.  Plan:  The final anticipated total dose therefore will correspond to 60.4 Gy.    _______________________________   Jodelle Gross, MD, PhD

## 2018-07-10 ENCOUNTER — Telehealth: Payer: Self-pay | Admitting: Hematology

## 2018-07-10 NOTE — Telephone Encounter (Signed)
Scheduled appt per 12/09 sch message - reminder letter sent in the mail with appt date and time

## 2018-07-11 DIAGNOSIS — Z510 Encounter for antineoplastic radiation therapy: Secondary | ICD-10-CM | POA: Diagnosis not present

## 2018-07-11 DIAGNOSIS — Z170 Estrogen receptor positive status [ER+]: Secondary | ICD-10-CM | POA: Diagnosis not present

## 2018-07-11 DIAGNOSIS — D0511 Intraductal carcinoma in situ of right breast: Secondary | ICD-10-CM | POA: Diagnosis not present

## 2018-07-15 ENCOUNTER — Ambulatory Visit
Admission: RE | Admit: 2018-07-15 | Discharge: 2018-07-15 | Disposition: A | Discharge status: Home / Self Care | Payer: BLUE CROSS/BLUE SHIELD | Source: Ambulatory Visit | Attending: Radiation Oncology | Admitting: Radiation Oncology

## 2018-07-15 DIAGNOSIS — D0511 Intraductal carcinoma in situ of right breast: Secondary | ICD-10-CM | POA: Diagnosis not present

## 2018-07-15 DIAGNOSIS — Z510 Encounter for antineoplastic radiation therapy: Secondary | ICD-10-CM | POA: Diagnosis not present

## 2018-07-15 DIAGNOSIS — Z170 Estrogen receptor positive status [ER+]: Secondary | ICD-10-CM | POA: Diagnosis not present

## 2018-07-16 ENCOUNTER — Ambulatory Visit
Admission: RE | Admit: 2018-07-16 | Discharge: 2018-07-16 | Disposition: A | Discharge status: Home / Self Care | Payer: BLUE CROSS/BLUE SHIELD | Source: Ambulatory Visit | Attending: Radiation Oncology | Admitting: Radiation Oncology

## 2018-07-16 DIAGNOSIS — Z170 Estrogen receptor positive status [ER+]: Secondary | ICD-10-CM | POA: Diagnosis not present

## 2018-07-16 DIAGNOSIS — Z510 Encounter for antineoplastic radiation therapy: Secondary | ICD-10-CM | POA: Diagnosis not present

## 2018-07-16 DIAGNOSIS — D0511 Intraductal carcinoma in situ of right breast: Secondary | ICD-10-CM | POA: Diagnosis not present

## 2018-07-17 ENCOUNTER — Ambulatory Visit
Admission: RE | Admit: 2018-07-17 | Discharge: 2018-07-17 | Disposition: A | Discharge status: Home / Self Care | Payer: BLUE CROSS/BLUE SHIELD | Source: Ambulatory Visit | Attending: Radiation Oncology | Admitting: Radiation Oncology

## 2018-07-17 DIAGNOSIS — Z510 Encounter for antineoplastic radiation therapy: Secondary | ICD-10-CM | POA: Diagnosis not present

## 2018-07-17 DIAGNOSIS — Z170 Estrogen receptor positive status [ER+]: Secondary | ICD-10-CM | POA: Diagnosis not present

## 2018-07-17 DIAGNOSIS — D0511 Intraductal carcinoma in situ of right breast: Secondary | ICD-10-CM | POA: Diagnosis not present

## 2018-07-18 ENCOUNTER — Ambulatory Visit
Admission: RE | Admit: 2018-07-18 | Discharge: 2018-07-18 | Disposition: A | Discharge status: Home / Self Care | Payer: BLUE CROSS/BLUE SHIELD | Source: Ambulatory Visit | Attending: Radiation Oncology | Admitting: Radiation Oncology

## 2018-07-18 DIAGNOSIS — Z170 Estrogen receptor positive status [ER+]: Secondary | ICD-10-CM | POA: Diagnosis not present

## 2018-07-18 DIAGNOSIS — Z510 Encounter for antineoplastic radiation therapy: Secondary | ICD-10-CM | POA: Diagnosis not present

## 2018-07-18 DIAGNOSIS — D0511 Intraductal carcinoma in situ of right breast: Secondary | ICD-10-CM

## 2018-07-18 MED ORDER — RADIAPLEXRX EX GEL
Freq: Once | CUTANEOUS | Status: AC
  Administered 2018-07-18: 13:00:00 via TOPICAL

## 2018-07-18 MED ORDER — ALRA NON-METALLIC DEODORANT (RAD-ONC)
1.0000 "application " | Freq: Once | TOPICAL | Status: AC
  Administered 2018-07-18: 1 via TOPICAL

## 2018-07-18 NOTE — Progress Notes (Signed)
Pt here for patient teaching.    Pt given Radiation and You booklet, skin care instructions, Alra deodorant and Radiaplex gel.    Reviewed areas of pertinence such as fatigue, hair loss, skin changes, breast tenderness and breast swelling .   Pt able to give teach back of to pat skin and use unscented/gentle soap,apply Radiaplex bid, avoid applying anything to skin within 4 hours of treatment, avoid wearing an under wire bra and to use an electric razor if they must shave.   Pt demonstrated understanding of information given and will contact nursing with any questions or concerns.    [email or web address]         

## 2018-07-19 ENCOUNTER — Ambulatory Visit
Admission: RE | Admit: 2018-07-19 | Discharge: 2018-07-19 | Disposition: A | Discharge status: Home / Self Care | Payer: BLUE CROSS/BLUE SHIELD | Source: Ambulatory Visit | Attending: Radiation Oncology | Admitting: Radiation Oncology

## 2018-07-19 DIAGNOSIS — Z170 Estrogen receptor positive status [ER+]: Secondary | ICD-10-CM | POA: Diagnosis not present

## 2018-07-19 DIAGNOSIS — D0511 Intraductal carcinoma in situ of right breast: Secondary | ICD-10-CM | POA: Diagnosis not present

## 2018-07-19 DIAGNOSIS — Z510 Encounter for antineoplastic radiation therapy: Secondary | ICD-10-CM | POA: Diagnosis not present

## 2018-07-22 ENCOUNTER — Other Ambulatory Visit: Payer: Self-pay

## 2018-07-22 ENCOUNTER — Ambulatory Visit
Admission: RE | Admit: 2018-07-22 | Discharge: 2018-07-22 | Disposition: A | Discharge status: Home / Self Care | Payer: BLUE CROSS/BLUE SHIELD | Source: Ambulatory Visit | Attending: Radiation Oncology | Admitting: Radiation Oncology

## 2018-07-22 DIAGNOSIS — D0511 Intraductal carcinoma in situ of right breast: Secondary | ICD-10-CM | POA: Diagnosis not present

## 2018-07-22 DIAGNOSIS — Z170 Estrogen receptor positive status [ER+]: Secondary | ICD-10-CM | POA: Diagnosis not present

## 2018-07-22 DIAGNOSIS — Z510 Encounter for antineoplastic radiation therapy: Secondary | ICD-10-CM | POA: Diagnosis not present

## 2018-07-22 MED ORDER — VENLAFAXINE HCL ER 75 MG PO CP24: 75.0000 mg | ORAL_CAPSULE | Freq: Every day | ORAL | 2 refills | Status: DC

## 2018-07-23 ENCOUNTER — Ambulatory Visit
Admission: RE | Admit: 2018-07-23 | Discharge: 2018-07-23 | Disposition: A | Discharge status: Home / Self Care | Payer: BLUE CROSS/BLUE SHIELD | Source: Ambulatory Visit | Attending: Radiation Oncology | Admitting: Radiation Oncology

## 2018-07-23 DIAGNOSIS — Z170 Estrogen receptor positive status [ER+]: Secondary | ICD-10-CM | POA: Diagnosis not present

## 2018-07-23 DIAGNOSIS — Z510 Encounter for antineoplastic radiation therapy: Secondary | ICD-10-CM | POA: Diagnosis not present

## 2018-07-23 DIAGNOSIS — D0511 Intraductal carcinoma in situ of right breast: Secondary | ICD-10-CM | POA: Diagnosis not present

## 2018-07-25 ENCOUNTER — Ambulatory Visit
Admission: RE | Admit: 2018-07-25 | Discharge: 2018-07-25 | Disposition: A | Discharge status: Home / Self Care | Payer: BLUE CROSS/BLUE SHIELD | Source: Ambulatory Visit | Attending: Radiation Oncology | Admitting: Radiation Oncology

## 2018-07-25 DIAGNOSIS — D0511 Intraductal carcinoma in situ of right breast: Secondary | ICD-10-CM | POA: Diagnosis not present

## 2018-07-25 DIAGNOSIS — Z510 Encounter for antineoplastic radiation therapy: Secondary | ICD-10-CM | POA: Diagnosis not present

## 2018-07-25 DIAGNOSIS — Z170 Estrogen receptor positive status [ER+]: Secondary | ICD-10-CM | POA: Diagnosis not present

## 2018-07-26 ENCOUNTER — Other Ambulatory Visit: Payer: Self-pay

## 2018-07-26 ENCOUNTER — Ambulatory Visit
Admission: RE | Admit: 2018-07-26 | Discharge: 2018-07-26 | Disposition: A | Discharge status: Home / Self Care | Payer: BLUE CROSS/BLUE SHIELD | Source: Ambulatory Visit | Attending: Radiation Oncology | Admitting: Radiation Oncology

## 2018-07-26 DIAGNOSIS — D0511 Intraductal carcinoma in situ of right breast: Secondary | ICD-10-CM

## 2018-07-26 DIAGNOSIS — Z510 Encounter for antineoplastic radiation therapy: Secondary | ICD-10-CM | POA: Diagnosis not present

## 2018-07-26 DIAGNOSIS — Z170 Estrogen receptor positive status [ER+]: Secondary | ICD-10-CM | POA: Diagnosis not present

## 2018-07-26 MED ORDER — VENLAFAXINE HCL ER 75 MG PO CP24: 75.0000 mg | ORAL_CAPSULE | Freq: Every day | ORAL | 2 refills | Status: DC

## 2018-07-29 ENCOUNTER — Ambulatory Visit
Admission: RE | Admit: 2018-07-29 | Discharge: 2018-07-29 | Disposition: A | Discharge status: Home / Self Care | Payer: BLUE CROSS/BLUE SHIELD | Source: Ambulatory Visit | Attending: Radiation Oncology | Admitting: Radiation Oncology

## 2018-07-29 DIAGNOSIS — Z170 Estrogen receptor positive status [ER+]: Secondary | ICD-10-CM | POA: Diagnosis not present

## 2018-07-29 DIAGNOSIS — Z510 Encounter for antineoplastic radiation therapy: Secondary | ICD-10-CM | POA: Diagnosis not present

## 2018-07-29 DIAGNOSIS — D0511 Intraductal carcinoma in situ of right breast: Secondary | ICD-10-CM | POA: Diagnosis not present

## 2018-07-30 ENCOUNTER — Ambulatory Visit
Admission: RE | Admit: 2018-07-30 | Discharge: 2018-07-30 | Disposition: A | Discharge status: Home / Self Care | Payer: BLUE CROSS/BLUE SHIELD | Source: Ambulatory Visit | Attending: Radiation Oncology | Admitting: Radiation Oncology

## 2018-07-30 DIAGNOSIS — Z170 Estrogen receptor positive status [ER+]: Secondary | ICD-10-CM | POA: Diagnosis not present

## 2018-07-30 DIAGNOSIS — D0511 Intraductal carcinoma in situ of right breast: Secondary | ICD-10-CM | POA: Diagnosis not present

## 2018-07-30 DIAGNOSIS — Z510 Encounter for antineoplastic radiation therapy: Secondary | ICD-10-CM | POA: Diagnosis not present

## 2018-08-01 ENCOUNTER — Ambulatory Visit
Admission: RE | Admit: 2018-08-01 | Discharge: 2018-08-01 | Disposition: A | Discharge status: Home / Self Care | Payer: BLUE CROSS/BLUE SHIELD | Source: Ambulatory Visit | Attending: Radiation Oncology | Admitting: Radiation Oncology

## 2018-08-01 DIAGNOSIS — Z170 Estrogen receptor positive status [ER+]: Secondary | ICD-10-CM | POA: Diagnosis not present

## 2018-08-01 DIAGNOSIS — Z510 Encounter for antineoplastic radiation therapy: Secondary | ICD-10-CM | POA: Insufficient documentation

## 2018-08-01 DIAGNOSIS — D0511 Intraductal carcinoma in situ of right breast: Secondary | ICD-10-CM | POA: Diagnosis not present

## 2018-08-02 ENCOUNTER — Ambulatory Visit
Admission: RE | Admit: 2018-08-02 | Discharge: 2018-08-02 | Disposition: A | Discharge status: Home / Self Care | Payer: BLUE CROSS/BLUE SHIELD | Source: Ambulatory Visit | Attending: Radiation Oncology | Admitting: Radiation Oncology

## 2018-08-02 DIAGNOSIS — D0511 Intraductal carcinoma in situ of right breast: Secondary | ICD-10-CM | POA: Diagnosis not present

## 2018-08-02 DIAGNOSIS — Z170 Estrogen receptor positive status [ER+]: Secondary | ICD-10-CM | POA: Diagnosis not present

## 2018-08-02 DIAGNOSIS — Z510 Encounter for antineoplastic radiation therapy: Secondary | ICD-10-CM | POA: Diagnosis not present

## 2018-08-05 ENCOUNTER — Ambulatory Visit
Admission: RE | Admit: 2018-08-05 | Discharge: 2018-08-05 | Disposition: A | Discharge status: Home / Self Care | Payer: BLUE CROSS/BLUE SHIELD | Source: Ambulatory Visit | Attending: Radiation Oncology | Admitting: Radiation Oncology

## 2018-08-05 DIAGNOSIS — Z170 Estrogen receptor positive status [ER+]: Secondary | ICD-10-CM | POA: Diagnosis not present

## 2018-08-05 DIAGNOSIS — Z510 Encounter for antineoplastic radiation therapy: Secondary | ICD-10-CM | POA: Diagnosis not present

## 2018-08-05 DIAGNOSIS — D0511 Intraductal carcinoma in situ of right breast: Secondary | ICD-10-CM | POA: Diagnosis not present

## 2018-08-06 ENCOUNTER — Ambulatory Visit
Admission: RE | Admit: 2018-08-06 | Discharge: 2018-08-06 | Disposition: A | Discharge status: Home / Self Care | Payer: BLUE CROSS/BLUE SHIELD | Source: Ambulatory Visit | Attending: Radiation Oncology | Admitting: Radiation Oncology

## 2018-08-06 DIAGNOSIS — Z170 Estrogen receptor positive status [ER+]: Secondary | ICD-10-CM | POA: Diagnosis not present

## 2018-08-06 DIAGNOSIS — Z510 Encounter for antineoplastic radiation therapy: Secondary | ICD-10-CM | POA: Diagnosis not present

## 2018-08-06 DIAGNOSIS — D0511 Intraductal carcinoma in situ of right breast: Secondary | ICD-10-CM | POA: Diagnosis not present

## 2018-08-07 ENCOUNTER — Ambulatory Visit
Admission: RE | Admit: 2018-08-07 | Discharge: 2018-08-07 | Disposition: A | Discharge status: Home / Self Care | Payer: BLUE CROSS/BLUE SHIELD | Source: Ambulatory Visit | Attending: Radiation Oncology | Admitting: Radiation Oncology

## 2018-08-07 DIAGNOSIS — Z170 Estrogen receptor positive status [ER+]: Secondary | ICD-10-CM | POA: Diagnosis not present

## 2018-08-07 DIAGNOSIS — Z510 Encounter for antineoplastic radiation therapy: Secondary | ICD-10-CM | POA: Diagnosis not present

## 2018-08-07 DIAGNOSIS — D0511 Intraductal carcinoma in situ of right breast: Secondary | ICD-10-CM | POA: Diagnosis not present

## 2018-08-08 ENCOUNTER — Ambulatory Visit
Admission: RE | Admit: 2018-08-08 | Discharge: 2018-08-08 | Disposition: A | Discharge status: Home / Self Care | Payer: BLUE CROSS/BLUE SHIELD | Source: Ambulatory Visit | Attending: Radiation Oncology | Admitting: Radiation Oncology

## 2018-08-08 DIAGNOSIS — Z510 Encounter for antineoplastic radiation therapy: Secondary | ICD-10-CM | POA: Diagnosis not present

## 2018-08-08 DIAGNOSIS — Z170 Estrogen receptor positive status [ER+]: Secondary | ICD-10-CM | POA: Diagnosis not present

## 2018-08-08 DIAGNOSIS — D0511 Intraductal carcinoma in situ of right breast: Secondary | ICD-10-CM | POA: Diagnosis not present

## 2018-08-09 ENCOUNTER — Ambulatory Visit: Payer: BLUE CROSS/BLUE SHIELD

## 2018-08-12 ENCOUNTER — Ambulatory Visit
Admission: RE | Admit: 2018-08-12 | Discharge: 2018-08-12 | Disposition: A | Discharge status: Home / Self Care | Payer: BLUE CROSS/BLUE SHIELD | Source: Ambulatory Visit | Attending: Radiation Oncology | Admitting: Radiation Oncology

## 2018-08-12 DIAGNOSIS — D0511 Intraductal carcinoma in situ of right breast: Secondary | ICD-10-CM | POA: Diagnosis not present

## 2018-08-12 DIAGNOSIS — Z170 Estrogen receptor positive status [ER+]: Secondary | ICD-10-CM | POA: Diagnosis not present

## 2018-08-12 DIAGNOSIS — Z510 Encounter for antineoplastic radiation therapy: Secondary | ICD-10-CM | POA: Diagnosis not present

## 2018-08-13 ENCOUNTER — Ambulatory Visit
Admission: RE | Admit: 2018-08-13 | Discharge: 2018-08-13 | Disposition: A | Discharge status: Home / Self Care | Payer: BLUE CROSS/BLUE SHIELD | Source: Ambulatory Visit | Attending: Radiation Oncology | Admitting: Radiation Oncology

## 2018-08-13 DIAGNOSIS — Z170 Estrogen receptor positive status [ER+]: Secondary | ICD-10-CM | POA: Diagnosis not present

## 2018-08-13 DIAGNOSIS — D0511 Intraductal carcinoma in situ of right breast: Secondary | ICD-10-CM | POA: Diagnosis not present

## 2018-08-13 DIAGNOSIS — Z510 Encounter for antineoplastic radiation therapy: Secondary | ICD-10-CM | POA: Diagnosis not present

## 2018-08-14 ENCOUNTER — Other Ambulatory Visit: Payer: Self-pay | Admitting: Hematology

## 2018-08-14 ENCOUNTER — Ambulatory Visit
Admission: RE | Admit: 2018-08-14 | Discharge: 2018-08-14 | Disposition: A | Discharge status: Home / Self Care | Payer: BLUE CROSS/BLUE SHIELD | Source: Ambulatory Visit | Attending: Radiation Oncology | Admitting: Radiation Oncology

## 2018-08-14 DIAGNOSIS — D0511 Intraductal carcinoma in situ of right breast: Secondary | ICD-10-CM

## 2018-08-14 DIAGNOSIS — Z170 Estrogen receptor positive status [ER+]: Secondary | ICD-10-CM | POA: Diagnosis not present

## 2018-08-14 DIAGNOSIS — Z510 Encounter for antineoplastic radiation therapy: Secondary | ICD-10-CM | POA: Diagnosis not present

## 2018-08-15 ENCOUNTER — Ambulatory Visit
Admission: RE | Admit: 2018-08-15 | Discharge: 2018-08-15 | Disposition: A | Discharge status: Home / Self Care | Payer: BLUE CROSS/BLUE SHIELD | Source: Ambulatory Visit | Attending: Radiation Oncology | Admitting: Radiation Oncology

## 2018-08-15 DIAGNOSIS — Z510 Encounter for antineoplastic radiation therapy: Secondary | ICD-10-CM | POA: Diagnosis not present

## 2018-08-15 DIAGNOSIS — D0511 Intraductal carcinoma in situ of right breast: Secondary | ICD-10-CM | POA: Diagnosis not present

## 2018-08-15 DIAGNOSIS — Z170 Estrogen receptor positive status [ER+]: Secondary | ICD-10-CM | POA: Diagnosis not present

## 2018-08-16 ENCOUNTER — Ambulatory Visit
Admission: RE | Admit: 2018-08-16 | Discharge: 2018-08-16 | Disposition: A | Discharge status: Home / Self Care | Payer: BLUE CROSS/BLUE SHIELD | Source: Ambulatory Visit | Attending: Radiation Oncology | Admitting: Radiation Oncology

## 2018-08-16 DIAGNOSIS — Z170 Estrogen receptor positive status [ER+]: Secondary | ICD-10-CM | POA: Diagnosis not present

## 2018-08-16 DIAGNOSIS — D0511 Intraductal carcinoma in situ of right breast: Secondary | ICD-10-CM | POA: Diagnosis not present

## 2018-08-16 DIAGNOSIS — Z510 Encounter for antineoplastic radiation therapy: Secondary | ICD-10-CM | POA: Diagnosis not present

## 2018-08-19 ENCOUNTER — Ambulatory Visit
Admission: RE | Admit: 2018-08-19 | Discharge: 2018-08-19 | Disposition: A | Discharge status: Home / Self Care | Payer: BLUE CROSS/BLUE SHIELD | Source: Ambulatory Visit | Attending: Radiation Oncology | Admitting: Radiation Oncology

## 2018-08-19 DIAGNOSIS — Z510 Encounter for antineoplastic radiation therapy: Secondary | ICD-10-CM | POA: Diagnosis not present

## 2018-08-19 DIAGNOSIS — D0511 Intraductal carcinoma in situ of right breast: Secondary | ICD-10-CM | POA: Diagnosis not present

## 2018-08-19 DIAGNOSIS — Z170 Estrogen receptor positive status [ER+]: Secondary | ICD-10-CM | POA: Diagnosis not present

## 2018-08-20 ENCOUNTER — Ambulatory Visit
Admission: RE | Admit: 2018-08-20 | Discharge: 2018-08-20 | Disposition: A | Discharge status: Home / Self Care | Payer: BLUE CROSS/BLUE SHIELD | Source: Ambulatory Visit | Attending: Radiation Oncology | Admitting: Radiation Oncology

## 2018-08-20 DIAGNOSIS — Z170 Estrogen receptor positive status [ER+]: Secondary | ICD-10-CM | POA: Diagnosis not present

## 2018-08-20 DIAGNOSIS — Z510 Encounter for antineoplastic radiation therapy: Secondary | ICD-10-CM | POA: Diagnosis not present

## 2018-08-20 DIAGNOSIS — D0511 Intraductal carcinoma in situ of right breast: Secondary | ICD-10-CM | POA: Diagnosis not present

## 2018-08-21 ENCOUNTER — Ambulatory Visit
Admission: RE | Admit: 2018-08-21 | Discharge: 2018-08-21 | Disposition: A | Discharge status: Home / Self Care | Payer: BLUE CROSS/BLUE SHIELD | Source: Ambulatory Visit | Attending: Radiation Oncology | Admitting: Radiation Oncology

## 2018-08-21 DIAGNOSIS — D0511 Intraductal carcinoma in situ of right breast: Secondary | ICD-10-CM | POA: Diagnosis not present

## 2018-08-21 DIAGNOSIS — Z510 Encounter for antineoplastic radiation therapy: Secondary | ICD-10-CM | POA: Diagnosis not present

## 2018-08-21 DIAGNOSIS — Z170 Estrogen receptor positive status [ER+]: Secondary | ICD-10-CM | POA: Diagnosis not present

## 2018-08-22 ENCOUNTER — Ambulatory Visit
Admission: RE | Admit: 2018-08-22 | Discharge: 2018-08-22 | Disposition: A | Discharge status: Home / Self Care | Payer: BLUE CROSS/BLUE SHIELD | Source: Ambulatory Visit | Attending: Radiation Oncology | Admitting: Radiation Oncology

## 2018-08-22 DIAGNOSIS — Z510 Encounter for antineoplastic radiation therapy: Secondary | ICD-10-CM | POA: Diagnosis not present

## 2018-08-22 DIAGNOSIS — Z170 Estrogen receptor positive status [ER+]: Secondary | ICD-10-CM | POA: Diagnosis not present

## 2018-08-22 DIAGNOSIS — D0511 Intraductal carcinoma in situ of right breast: Secondary | ICD-10-CM | POA: Diagnosis not present

## 2018-08-23 ENCOUNTER — Ambulatory Visit
Admission: RE | Admit: 2018-08-23 | Discharge: 2018-08-23 | Disposition: A | Discharge status: Home / Self Care | Payer: BLUE CROSS/BLUE SHIELD | Source: Ambulatory Visit | Attending: Radiation Oncology | Admitting: Radiation Oncology

## 2018-08-23 ENCOUNTER — Ambulatory Visit: Payer: BLUE CROSS/BLUE SHIELD | Admitting: Radiation Oncology

## 2018-08-23 DIAGNOSIS — Z510 Encounter for antineoplastic radiation therapy: Secondary | ICD-10-CM | POA: Diagnosis not present

## 2018-08-23 DIAGNOSIS — Z170 Estrogen receptor positive status [ER+]: Secondary | ICD-10-CM | POA: Diagnosis not present

## 2018-08-23 DIAGNOSIS — D0511 Intraductal carcinoma in situ of right breast: Secondary | ICD-10-CM | POA: Diagnosis not present

## 2018-08-26 ENCOUNTER — Ambulatory Visit
Admission: RE | Admit: 2018-08-26 | Discharge: 2018-08-26 | Disposition: A | Discharge status: Home / Self Care | Payer: BLUE CROSS/BLUE SHIELD | Source: Ambulatory Visit | Attending: Radiation Oncology | Admitting: Radiation Oncology

## 2018-08-26 ENCOUNTER — Ambulatory Visit: Payer: BLUE CROSS/BLUE SHIELD

## 2018-08-26 DIAGNOSIS — Z170 Estrogen receptor positive status [ER+]: Secondary | ICD-10-CM | POA: Diagnosis not present

## 2018-08-26 DIAGNOSIS — Z510 Encounter for antineoplastic radiation therapy: Secondary | ICD-10-CM | POA: Diagnosis not present

## 2018-08-26 DIAGNOSIS — D0511 Intraductal carcinoma in situ of right breast: Secondary | ICD-10-CM | POA: Diagnosis not present

## 2018-08-27 ENCOUNTER — Ambulatory Visit: Payer: BLUE CROSS/BLUE SHIELD | Admitting: Hematology

## 2018-08-27 ENCOUNTER — Ambulatory Visit: Payer: BLUE CROSS/BLUE SHIELD

## 2018-08-27 ENCOUNTER — Ambulatory Visit
Admission: RE | Admit: 2018-08-27 | Discharge: 2018-08-27 | Disposition: A | Discharge status: Home / Self Care | Payer: BLUE CROSS/BLUE SHIELD | Source: Ambulatory Visit | Attending: Radiation Oncology | Admitting: Radiation Oncology

## 2018-08-27 DIAGNOSIS — Z170 Estrogen receptor positive status [ER+]: Secondary | ICD-10-CM | POA: Diagnosis not present

## 2018-08-27 DIAGNOSIS — D0511 Intraductal carcinoma in situ of right breast: Secondary | ICD-10-CM | POA: Diagnosis not present

## 2018-08-27 DIAGNOSIS — Z510 Encounter for antineoplastic radiation therapy: Secondary | ICD-10-CM | POA: Diagnosis not present

## 2018-08-28 ENCOUNTER — Encounter: Payer: Self-pay | Admitting: Hematology

## 2018-08-28 ENCOUNTER — Ambulatory Visit
Admission: RE | Admit: 2018-08-28 | Discharge: 2018-08-28 | Disposition: A | Discharge status: Home / Self Care | Payer: BLUE CROSS/BLUE SHIELD | Source: Ambulatory Visit | Attending: Radiation Oncology | Admitting: Radiation Oncology

## 2018-08-28 ENCOUNTER — Inpatient Hospital Stay: Payer: BLUE CROSS/BLUE SHIELD | Admitting: Hematology

## 2018-08-28 VITALS — BP 127/87 | HR 93 | Temp 97.8°F | Resp 18 | Ht 63.0 in | Wt 157.6 lb

## 2018-08-28 DIAGNOSIS — Z170 Estrogen receptor positive status [ER+]: Secondary | ICD-10-CM

## 2018-08-28 DIAGNOSIS — Z510 Encounter for antineoplastic radiation therapy: Secondary | ICD-10-CM | POA: Diagnosis not present

## 2018-08-28 DIAGNOSIS — D0511 Intraductal carcinoma in situ of right breast: Secondary | ICD-10-CM

## 2018-08-28 MED ORDER — ANASTROZOLE 1 MG PO TABS: 1.0000 mg | ORAL_TABLET | Freq: Every day | ORAL | 3 refills | Status: DC

## 2018-08-28 MED ORDER — VENLAFAXINE HCL ER 150 MG PO CP24: 150.0000 mg | ORAL_CAPSULE | Freq: Every day | ORAL | 2 refills | Status: DC

## 2018-08-28 NOTE — Progress Notes (Signed)
[place]   Telephone:([phone] Fax:(336) [phone]   Clinic Follow up Note   Patient Care Team: Via, Robin Bihari, MD as PCP - General (Family Medicine) Robin Skates, MD as Consulting Physician (General Surgery) Robin Merle, MD as Consulting Physician (Hematology) Robin Rudd, MD as Consulting Physician (Radiation Oncology)  Date of Service:  08/28/2018  CHIEF COMPLAINT: F/u of Right breast DCIS  SUMMARY OF ONCOLOGIC HISTORY: Oncology History   Cancer Staging Ductal carcinoma in situ (DCIS) of right breast Staging form: Breast, AJCC 8th Edition - Clinical stage from 05/16/2018: Stage 0 (cTis (DCIS), cN0, cM0, ER+, PR-, HER2: Not Assessed) - Signed by Robin Merle, MD on 05/21/2018       Ductal carcinoma in situ (DCIS) of right breast   05/08/2018 Breast US    05/08/2018 Breast US IMPRESSION: 1. Interval resolution of small mass in the UPPER portion of the RIGHT breast for which follow-up was recommended in 2017. 2. Two new areas of calcifications are suspicious for malignancy and biopsy is indicated. The likely sonographic correlates have been identified to facilitate biopsy options, given the presence of implants. We discussed the possibility and low likelihood of implant rupture at the time of biopsy. 3. No adenopathy by ultrasound.    05/08/2018 Mammogram    05/08/2018 Diagnostic Mammogram IMPRESSION: 1. Interval resolution of small mass in the UPPER portion of the RIGHT breast for which follow-up was recommended in 2017. 2. Two new areas of calcifications are suspicious for malignancy and biopsy is indicated. The likely sonographic correlates have been identified to facilitate biopsy options, given the presence of implants. We discussed the possibility and low likelihood of implant rupture at the time of biopsy. 3. No adenopathy by ultrasound.    05/16/2018 Cancer Staging    Staging form: Breast, AJCC 8th Edition - Clinical stage from 05/16/2018: Stage  0 (cTis (DCIS), cN0, cM0, ER+, PR-, HER2: Not Assessed) - Signed by Robin Merle, MD on 05/21/2018    05/16/2018 Receptors her2    05/16/2018 Surgical Pathology ADDITIONAL INFORMATION: 1. PROGNOSTIC INDICATORS Results: IMMUNOHISTOCHEMICAL AND MORPHOMETRIC ANALYSIS PERFORMED MANUALLY Estrogen Receptor: 50%, POSITIVE, STRONG STAINING INTENSITY Progesterone Receptor: 0%, NEGATIVE    05/16/2018 Pathology Results    Diagnosis 1. 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 2. Breast, right, needle core biopsy, 12:30 o'clock, 4cm from nipple - FOCAL HIGH GRADE DUCTAL CARCINOMA IN SITU.    05/20/2018 Initial Diagnosis    Ductal carcinoma in situ (DCIS) of right breast    06/10/2018 Surgery    Surgery 06/10/2018 Panel 1: Right BREAST LUMPECTOMY X2 WITH RADIOACTIVE SEED LOCALIZATION X2 with Robin Skates, MD    06/10/2018 Cancer Staging    Staging form: Breast, AJCC 8th Edition - Pathologic stage from 06/10/2018: Stage Unknown (pTis (DCIS), pNX, cM0, G3, ER+, PR-, HER2: Not Assessed) - Signed by Robin Merle, MD on 08/27/2018    07/15/2018 -  Radiation Therapy     Radiation daily treatment with Dr. Lisbeth Boyd on 07/15/2018-09/02/18     Anti-estrogen oral therapy    Robin Boyd 72m dialy to start in 08/2018      CURRENT THERAPY:  Adjuvant radiation with Dr. MLisbeth Renshaw12/16/19-09/02/18 Robin Boyd 158mdaily to start in 08/2018  INTERVAL HISTORY:  JuRoyale Boyd here for a follow up post surgery and recovered well. She is currently under radiation and tolerating well. She presents to the clinic alone today doing well.  She notes she  had hyper and hot flashes with menopause. She has found Effexor helpful but is willing to increase dose.     REVIEW OF SYSTEMS:   Constitutional: Denies fevers, chills or abnormal weight loss (+) hot flash Eyes: Denies blurriness of vision Ears, nose, mouth, throat, and  face: Denies mucositis or sore throat Respiratory: Denies cough, dyspnea or wheezes Cardiovascular: Denies palpitation, chest discomfort or lower extremity swelling Gastrointestinal:  Denies nausea, heartburn or change in bowel habits Skin: Denies abnormal skin rashes Lymphatics: Denies new lymphadenopathy or easy bruising Neurological:Denies numbness, tingling or new weaknesses Behavioral/Psych: (+) Improved mood swings   All other systems were reviewed with the patient and are negative.  MEDICAL HISTORY:  Past Medical History:  Diagnosis Date  . Depression   . Hyperlipidemia     SURGICAL HISTORY: Past Surgical History:  Procedure Laterality Date  . ABDOMINAL HYSTERECTOMY  2012   ABDOMINAL HYSTERECTOMY WITH OVARIAN PRESERVATION - [place]   . AUGMENTATION MAMMAPLASTY  0388,    SILICONE  . BREAST LUMPECTOMY WITH RADIOACTIVE SEED LOCALIZATION Right 06/10/2018   Procedure: BREAST LUMPECTOMY X2 WITH RADIOACTIVE SEED LOCALIZATION X2;  Surgeon: Robin Skates, MD;  Location: [facility];  Service: General;  Laterality: Right;  . BREAST SURGERY    . TUBAL LIGATION      I have reviewed the social history and family history with the patient and they are unchanged from previous note.  ALLERGIES:  has No Known Allergies.  MEDICATIONS:  Current Outpatient Medications  Medication Sig Dispense Refill  . atorvastatin (LIPITOR) 40 MG tablet     . Robin Boyd (ARIMIDEX) 1 MG tablet Take 1 tablet (1 mg total) by mouth daily. 30 tablet 3  . venlafaxine XR (EFFEXOR XR) 150 MG 24 hr capsule Take 1 capsule (150 mg total) by mouth daily with breakfast. 30 capsule 2   No current facility-administered medications for this visit.     PHYSICAL EXAMINATION: ECOG PERFORMANCE STATUS: 1 - Symptomatic but completely ambulatory  Vitals:   08/28/18 1203  BP: 127/87  Pulse: 93  Resp: 18  Temp: 97.8 F (36.6 C)  SpO2: 98%   Filed Weights   08/28/18 1203  Weight: 157 lb 9.6 oz  (71.5 kg)    GENERAL:alert, no distress and comfortable SKIN: skin color, texture, turgor are normal, no rashes or significant lesions EYES: normal, Conjunctiva are pink and non-injected, sclera clear OROPHARYNX:no exudate, no erythema and lips, buccal mucosa, and tongue normal  NECK: supple, thyroid normal size, non-tender, without nodularity LYMPH:  no palpable lymphadenopathy in the cervical, axillary or inguinal LUNGS: clear to auscultation and percussion with normal breathing effort HEART: regular rate & rhythm and no murmurs and no lower extremity edema ABDOMEN:abdomen soft, non-tender and normal bowel sounds Musculoskeletal:no cyanosis of digits and no clubbing  NEURO: alert & oriented x 3 with fluent speech, no focal motor/sensory deficits BREAST: S/p right breast lumpectomy: surgical incision healed well (+) diffuse skin erythema and hyperpigmentation from radiation   LABORATORY DATA:  I have reviewed the data as listed CBC Latest Ref Rng & Units 05/22/2018 09/25/2017 12/22/2015  WBC 4.0 - 10.5 K/uL 6.1 19.8(H) 4.8  Hemoglobin 12.0 - 15.0 g/dL 13.4 14.6 13.2  Hematocrit 36.0 - 46.0 % 42.1 46.0 40.6  Platelets 150 - 400 K/uL 253 427(H) 254     CMP Latest Ref Rng & Units 05/22/2018 09/25/2017 07/10/2016  Glucose 70 - 99 mg/dL 88 153(H) -  BUN 6 - 20 mg/dL 11 23(H) -  Creatinine 0.44 -  1.00 mg/dL 0.94 0.92 -  Sodium 135 - 145 mmol/L 143 139 -  Potassium 3.5 - 5.1 mmol/L 4.1 4.7 -  Chloride 98 - 111 mmol/L 106 104 -  CO2 22 - 32 mmol/L 25 24 -  Calcium 8.9 - 10.3 mg/dL 9.8 8.5(L) -  Total Protein 6.5 - 8.1 g/dL 7.8 7.0 -  Total Bilirubin 0.3 - 1.2 mg/dL 0.5 0.4 -  Alkaline Phos 38 - 126 U/L 48 84 -  AST 15 - 41 U/L _0 ALT 0 - 44 U/L _1 RADIOGRAPHIC STUDIES: I have personally reviewed the radiological images as listed and agreed with the findings in the report. No results found.   ASSESSMENT & PLAN:  Delberta Folts is a 63 y.o. female with    1.  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 Her DCIS was cured by complete surgical resection with right breast lumpectomy in 05/2018. Surgical margins were negative. - Any form of adjuvant therapy is preventive. -She started adjuvant radiation and plans to complete on 09/02/18. She is tolerating well.  -Given her high grade disease, and positive ER, I do recommend antiestrogen therapy with Robin Boyd, which decrease her risk of future breast cancer.   --The potential benefit and side effects, which includes but not limited to, hot flash, skin and vaginal dryness, metabolic changes ( increased blood glucose, cholesterol, weight, etc.), slightly in increased risk of cardiovascular disease, cataracts, muscular and joint discomfort, osteopenia and osteoporosis, etc, were discussed with her in great details. She is interested, and we'll start after she completes radiation and her hot flashes are better controlled.  -I offered her the chance to attend survivorship clinic post treatment. She is interested.  -Survivorship clinic in 3 months     2. Hot Flashes, Mood swings -secondary to menopause, she was on hormonal replacement, which she stopped when she was diagnosed with breast cancer. -Currently on Effexor 46m dialy.  -Given Robin Boyd will likely exacerbate her hot flashes, I will increase her Effexor to 1524mtoday to better control her symptoms.   3. Bone health  -Last DEXA in 11/2015 was normal through her Gyn  -I discussed Robin Boyd can potentially weaken her bone. Will monitor  -She is overdue for DEXA, I will schedule to be done in the next few weeks    PLAN: -I prescribed her Robin Boyd, she will start in 2-4 weeks when her hot flush is better controlled -I refilled her Effexor with dose increased to 15052maily.  -Lab and survivorship with [facility] in 3 months    No problem-specific Assessment & Plan notes found for this encounter.   No  orders of the defined types were placed in this encounter.  All questions were answered. The patient knows to call the clinic with any problems, questions or concerns. No barriers to learning was detected. I spent 20 minutes counseling the patient face to face. The total time spent in the appointment was 25 minutes and more than 50% was on counseling and review of test results     YanTruitt MerleD 08/28/2018   I, AmoJoslyn Devonm acting as scribe for YanTruitt MerleD.    I have reviewed the above documentation for accuracy and completeness, and I agree with the above.

## 2018-08-29 ENCOUNTER — Ambulatory Visit
Admission: RE | Admit: 2018-08-29 | Discharge: 2018-08-29 | Disposition: A | Discharge status: Home / Self Care | Payer: BLUE CROSS/BLUE SHIELD | Source: Ambulatory Visit | Attending: Radiation Oncology | Admitting: Radiation Oncology

## 2018-08-29 ENCOUNTER — Telehealth: Payer: Self-pay | Admitting: Hematology

## 2018-08-29 ENCOUNTER — Telehealth: Payer: Self-pay

## 2018-08-29 DIAGNOSIS — Z170 Estrogen receptor positive status [ER+]: Secondary | ICD-10-CM | POA: Diagnosis not present

## 2018-08-29 DIAGNOSIS — Z510 Encounter for antineoplastic radiation therapy: Secondary | ICD-10-CM | POA: Diagnosis not present

## 2018-08-29 DIAGNOSIS — D0511 Intraductal carcinoma in situ of right breast: Secondary | ICD-10-CM | POA: Diagnosis not present

## 2018-08-29 NOTE — Telephone Encounter (Signed)
Printed calender for patient from her 1/29 visit

## 2018-08-29 NOTE — Telephone Encounter (Signed)
Called patient and scheduled appt per 01/29 los.  Patient aware of appt.

## 2018-08-30 ENCOUNTER — Ambulatory Visit
Admission: RE | Admit: 2018-08-30 | Discharge: 2018-08-30 | Disposition: A | Discharge status: Home / Self Care | Payer: BLUE CROSS/BLUE SHIELD | Source: Ambulatory Visit | Attending: Radiation Oncology | Admitting: Radiation Oncology

## 2018-08-30 ENCOUNTER — Ambulatory Visit: Payer: BLUE CROSS/BLUE SHIELD

## 2018-08-30 DIAGNOSIS — D0511 Intraductal carcinoma in situ of right breast: Secondary | ICD-10-CM | POA: Diagnosis not present

## 2018-08-30 DIAGNOSIS — Z170 Estrogen receptor positive status [ER+]: Secondary | ICD-10-CM | POA: Diagnosis not present

## 2018-08-30 DIAGNOSIS — Z510 Encounter for antineoplastic radiation therapy: Secondary | ICD-10-CM | POA: Diagnosis not present

## 2018-09-02 ENCOUNTER — Encounter: Payer: Self-pay | Admitting: Radiation Oncology

## 2018-09-02 ENCOUNTER — Ambulatory Visit
Admission: RE | Admit: 2018-09-02 | Discharge: 2018-09-02 | Disposition: A | Discharge status: Home / Self Care | Payer: BLUE CROSS/BLUE SHIELD | Source: Ambulatory Visit | Attending: Radiation Oncology | Admitting: Radiation Oncology

## 2018-09-02 DIAGNOSIS — D0511 Intraductal carcinoma in situ of right breast: Secondary | ICD-10-CM | POA: Diagnosis not present

## 2018-09-02 DIAGNOSIS — Z170 Estrogen receptor positive status [ER+]: Secondary | ICD-10-CM | POA: Diagnosis not present

## 2018-09-02 DIAGNOSIS — Z510 Encounter for antineoplastic radiation therapy: Secondary | ICD-10-CM | POA: Diagnosis not present

## 2018-10-14 ENCOUNTER — Telehealth: Payer: Self-pay | Admitting: Radiation Oncology

## 2018-10-14 NOTE — Telephone Encounter (Signed)
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  She finished radiation to the right breast a little over a month ago, and reports her skin has gone back to its normal color.  She denies any concerns with fatigue or other symptoms since completing radiation.  She is scheduled to follow-up with survivorship clinic in May, she also states that she has been at home watching her grandchildren and has also had the flu herself.  We talked about skin care considerations including avoiding sun exposure.  She is in agreement with this plan.  Given the situation including her current illness, we will plan to cancel tomorrow's appointment.  She is advised to contact us if she has any questions or concerns and to keep her appointment in May with survivorship clinic.

## 2018-10-15 ENCOUNTER — Ambulatory Visit: Payer: BLUE CROSS/BLUE SHIELD | Admitting: Radiation Oncology

## 2018-10-15 NOTE — Progress Notes (Signed)
  Radiation Oncology         [phone]) [phone] ________________________________  Name: Robin Boyd MRN: [id number]  Date: 09/02/2018  DOB: 03/04/1956  End of Treatment Note  Diagnosis:   63 y.o. female with High Grade ER positive DCIS of the right breast  Indication for treatment:  Curative       Radiation treatment dates:   07/15/2018 - 09/02/2018  Site/dose:   The patient initially received a dose of 50.4 Gy in 28 fractions to the right breast using whole-breast tangent fields. This was delivered using a 3-D conformal technique. The patient then received a boost to the seroma. This delivered an additional 10 Gy in 5 fractions using 9E electrons with a special teletherapy technique. The total dose was 60.4 Gy.  Narrative: The patient tolerated radiation treatment relatively well.   The patient had some expected skin irritation with diffuse erythema and hyperpigmentation as she progressed during treatment. Moist desquamation was not present at the end of treatment. She is applying Radiaplex to her skin as ordered.  Plan: The patient has completed radiation treatment. The patient will return to radiation oncology clinic for routine followup in one month. I advised the patient to call or return sooner if they have any questions or concerns related to their recovery or treatment. ________________________________  Jodelle Gross, MD, PhD  This document serves as a record of services personally performed by Kyung Rudd, MD. It was created on his behalf by Rae Lips, a trained medical scribe. The creation of this record is based on the scribe's personal observations and the provider's statements to them. This document has been checked and approved by the attending provider.

## 2018-11-28 DIAGNOSIS — M779 Enthesopathy, unspecified: Secondary | ICD-10-CM | POA: Diagnosis not present

## 2018-11-29 ENCOUNTER — Other Ambulatory Visit: Payer: Self-pay | Admitting: Adult Health

## 2018-11-29 ENCOUNTER — Telehealth: Payer: Self-pay | Admitting: Adult Health

## 2018-11-29 DIAGNOSIS — D0511 Intraductal carcinoma in situ of right breast: Secondary | ICD-10-CM

## 2018-11-29 NOTE — Telephone Encounter (Signed)
Spoke with patient and educated her on how to use Web[employer]. Sent the join link to her email and verified that she received it. °

## 2018-11-30 ENCOUNTER — Other Ambulatory Visit: Payer: Self-pay | Admitting: Hematology

## 2018-12-02 ENCOUNTER — Inpatient Hospital Stay: Payer: BLUE CROSS/BLUE SHIELD | Attending: Hematology

## 2018-12-02 ENCOUNTER — Inpatient Hospital Stay (HOSPITAL_BASED_OUTPATIENT_CLINIC_OR_DEPARTMENT_OTHER): Payer: BLUE CROSS/BLUE SHIELD | Admitting: Adult Health

## 2018-12-02 ENCOUNTER — Encounter: Payer: Self-pay | Admitting: Adult Health

## 2018-12-02 DIAGNOSIS — Z79899 Other long term (current) drug therapy: Secondary | ICD-10-CM

## 2018-12-02 DIAGNOSIS — D0511 Intraductal carcinoma in situ of right breast: Secondary | ICD-10-CM | POA: Diagnosis not present

## 2018-12-02 DIAGNOSIS — Z923 Personal history of irradiation: Secondary | ICD-10-CM | POA: Diagnosis not present

## 2018-12-02 DIAGNOSIS — Z170 Estrogen receptor positive status [ER+]: Secondary | ICD-10-CM

## 2018-12-02 DIAGNOSIS — Z79811 Long term (current) use of aromatase inhibitors: Secondary | ICD-10-CM

## 2018-12-02 NOTE — Progress Notes (Signed)
SURVIVORSHIP VIRTUAL VISIT:  I connected with Robin Boyd on 12/02/18 at  3:00 PM EDT by [facility] video visit and verified that I am speaking with the correct person using two identifiers.   I discussed the limitations, risks, security and privacy concerns of performing an evaluation and management service by telephone and the availability of in person appointments. I also discussed with the patient that there may be a patient responsible charge related to this service. The patient expressed understanding and agreed to proceed.     REASON FOR VISIT:  Routine follow-up for history of breast cancer.   BRIEF ONCOLOGIC HISTORY:  Oncology History   Cancer Staging Ductal carcinoma in situ (DCIS) of right breast Staging form: Breast, AJCC 8th Edition - Clinical stage from 05/16/2018: Stage 0 (cTis (DCIS), cN0, cM0, ER+, PR-, HER2: Not Assessed) - Signed by Truitt Merle, MD on 05/21/2018       Ductal carcinoma in situ (DCIS) of right breast   05/08/2018 Breast US    05/08/2018 Breast US IMPRESSION: 1. Interval resolution of small mass in the UPPER portion of the RIGHT breast for which follow-up was recommended in 2017. 2. Two new areas of calcifications are suspicious for malignancy and biopsy is indicated. The likely sonographic correlates have been identified to facilitate biopsy options, given the presence of implants. We discussed the possibility and low likelihood of implant rupture at the time of biopsy. 3. No adenopathy by ultrasound.    05/08/2018 Mammogram    05/08/2018 Diagnostic Mammogram IMPRESSION: 1. Interval resolution of small mass in the UPPER portion of the RIGHT breast for which follow-up was recommended in 2017. 2. Two new areas of calcifications are suspicious for malignancy and biopsy is indicated. The likely sonographic correlates have been identified to facilitate biopsy options, given the presence of implants. We discussed the possibility and low likelihood of  implant rupture at the time of biopsy. 3. No adenopathy by ultrasound.    05/16/2018 Cancer Staging    Staging form: Breast, AJCC 8th Edition - Clinical stage from 05/16/2018: Stage 0 (cTis (DCIS), cN0, cM0, ER+, PR-, HER2: Not Assessed) - Signed by Truitt Merle, MD on 05/21/2018    05/16/2018 Receptors her2    05/16/2018 Surgical Pathology ADDITIONAL INFORMATION: 1. PROGNOSTIC INDICATORS Results: IMMUNOHISTOCHEMICAL AND MORPHOMETRIC ANALYSIS PERFORMED MANUALLY Estrogen Receptor: 50%, POSITIVE, STRONG STAINING INTENSITY Progesterone Receptor: 0%, NEGATIVE    05/16/2018 Pathology Results    Diagnosis 1. 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 2. Breast, right, needle core biopsy, 12:30 o'clock, 4cm from nipple - FOCAL HIGH GRADE DUCTAL CARCINOMA IN SITU.    05/20/2018 Initial Diagnosis    Ductal carcinoma in situ (DCIS) of right breast    06/10/2018 Surgery    Surgery 06/10/2018 Panel 1: Right BREAST LUMPECTOMY X2 WITH RADIOACTIVE SEED LOCALIZATION X2 with Fanny Skates, MD    06/10/2018 Cancer Staging    Staging form: Breast, AJCC 8th Edition - Pathologic stage from 06/10/2018: Stage Unknown (pTis (DCIS), pNX, cM0, G3, ER+, PR-, HER2: Not Assessed) - Signed by Truitt Merle, MD on 08/27/2018    07/15/2018 -  Radiation Therapy     Radiation daily treatment with Dr. Lisbeth Renshaw on 07/15/2018-09/02/18     Anti-estrogen oral therapy    Anastrozole 45m dialy to start in 08/2018      INTERVAL HISTORY:  [name]  [facility].  Overall, she reports feeling quite well. She was treated for DCIS and is doing well.  She was mistakenly scheduled for a LTS visit, instead of a SCP visit.  She is due to review her SCP.    Robin Boyd notes that she is doing very well.  She continues to heal from radiation, and has some  discoloration left, but otherwise is doing well.      REVIEW OF SYSTEMS:  Review of Systems  Constitutional: Negative for appetite change, chills, fatigue, fever and unexpected weight change.  HENT:   Negative for hearing loss, lump/mass, sore throat and trouble swallowing.   Eyes: Negative for eye problems and icterus.  Respiratory: Negative for chest tightness, cough and shortness of breath.   Cardiovascular: Negative for chest pain, leg swelling and palpitations.  Gastrointestinal: Negative for abdominal distention, abdominal pain, constipation, diarrhea, nausea and vomiting.  Endocrine: Negative for hot flashes.  Musculoskeletal: Negative for arthralgias.  Skin: Negative for itching.  Neurological: Negative for dizziness, extremity weakness, headaches and numbness.  Hematological: Negative for adenopathy. Does not bruise/bleed easily.  Psychiatric/Behavioral: Negative for depression. The patient is not nervous/anxious.   Breast: Denies any new nodularity, masses, tenderness, nipple changes, or nipple discharge.       PAST MEDICAL/SURGICAL HISTORY:  Past Medical History:  Diagnosis Date  . Depression   . Hyperlipidemia    Past Surgical History:  Procedure Laterality Date  . ABDOMINAL HYSTERECTOMY  2012   ABDOMINAL HYSTERECTOMY WITH OVARIAN PRESERVATION - [place]   . AUGMENTATION MAMMAPLASTY  3903,    SILICONE  . BREAST LUMPECTOMY WITH RADIOACTIVE SEED LOCALIZATION Right 06/10/2018   Procedure: BREAST LUMPECTOMY X2 WITH RADIOACTIVE SEED LOCALIZATION X2;  Surgeon: Fanny Skates, MD;  Location: [facility];  Service: General;  Laterality: Right;  . BREAST SURGERY    . TUBAL LIGATION       ALLERGIES:  No Known Allergies   CURRENT MEDICATIONS:  Outpatient Encounter Medications as of 12/02/2018  Medication Sig  . anastrozole (ARIMIDEX) 1 MG tablet Take 1 tablet (1 mg total) by mouth daily.  Marland Kitchen atorvastatin (LIPITOR) 40 MG tablet   . venlafaxine XR  (EFFEXOR-XR) 150 MG 24 hr capsule TAKE 1 CAPSULE DAILY WITH BREAKFAST   No facility-administered encounter medications on file as of 12/02/2018.      ONCOLOGIC FAMILY HISTORY:  History reviewed. No pertinent family history.  GENETIC COUNSELING/TESTING: Not at this time  SOCIAL HISTORY:    Social History   Socioeconomic History  . Marital status: Married    Spouse name: Not on file  . Number of children: Not on file  . Years of education: Not on file  . Highest education level: Not on file  Occupational History  . Not on file  Social Needs  . Financial resource strain: Not on file  . Food insecurity:    Worry: Not on file    Inability: Not on file  . Transportation needs:    Medical: Not on file    Non-medical: Not on file  Tobacco Use  . Smoking status: Never Smoker  . Smokeless tobacco: Never Used  Substance and Sexual Activity  . Alcohol use: Not Currently    Alcohol/week: 0.0 standard drinks  . Drug use: No  . Sexual activity: Yes    Comment: 1ST INTERCOURSE- 24, PARTNERS- 1  Lifestyle  . Physical activity:    Days per week: Not on file    Minutes per session: Not on file  . Stress: Not  on file  Relationships  . Social connections:    Talks on phone: Not on file    Gets together: Not on file    Attends religious service: Not on file    Active member of club or organization: Not on file    Attends meetings of clubs or organizations: Not on file    Relationship status: Not on file  . Intimate partner violence:    Fear of current or ex partner: Not on file    Emotionally abused: Not on file    Physically abused: Not on file    Forced sexual activity: Not on file  Other Topics Concern  . Not on file  Social History Narrative  . Not on file     OBJECTIVE:  Patient is well appearing in no apparent distress.  Skin is normal, breathing non labored, mood/behavior/affect all normal.  LABORATORY DATA:  None for this visit   DIAGNOSTIC IMAGING:  Most  recent mammogram:     ASSESSMENT AND PLAN:  Ms.. Philbert Boyd is a pleasant 63 y.o. female with history of Stage 0 right breast DCIS, ER+/PR+, diagnosed in 04/2018, treated with lumpectomy, adjuvant radiation therapy, has declined anti estrogen therapy.  She presents to the Survivorship Clinic for surveillance and routine follow-up.   1. History of breast cancer:  Robin Boyd is currently clinically and radiographically without evidence of disease or recurrence of breast cancer. She will be due for mammogram in 04/2018; orders placed today. We reviewed her SCP.  Unfortunately, she was scheduled as a LTS visit instead of SCP visit, and we did not have her SCP prepared.  I was able to share my screen with her over the web and review her SCP with her.  We will mail out her patient education and a hard copy of her SCP ASAP.  She has opted not to take Anastrozole.  She understands the risk in foregoing anastrozole.  She will f/u with Dr. Burr Medico in 3 months.   I encouraged her to call me with any questions or concerns before her next visit at the cancer center, and I would be happy to see her sooner, if needed.    2. Bone health:  Given [name]'s age, history of breast cancer, she is at risk for bone demineralization. I recommended she f/u with her PCP regarding bone density testing. She was given education on specific food and activities to promote bone health.  3. Cancer screening:  Due to [name]'s history and her age, she should receive screening for skin cancers, colon cancer, and gynecologic cancers. She was encouraged to follow-up with her PCP for appropriate cancer screenings.   4. Health maintenance and wellness promotion: Robin Boyd was encouraged to consume 5-7 servings of fruits and vegetables per day. She was also encouraged to engage in moderate to vigorous exercise for 30 minutes per day most days of the week. She was instructed to limit her alcohol consumption and continue to abstain from  tobacco use.      Follow up instructions:    -Return to cancer center 04/2019  -Mammogram due in 05/2019 -Follow up with surgery 07/2019   The patient was provided an opportunity to ask questions and all were answered. The patient agreed with the plan and demonstrated an understanding of the instructions.   The patient was advised to call back or seek an in-person evaluation if the symptoms worsen or if the condition fails to improve as anticipated.   I provided 30 minutes  of face-to-face video visit time during this encounter, and > 50% was spent counseling as documented under my assessment & plan.   Gardenia Phlegm, NP Survivorship Program [place] [phone]   Note: PRIMARY CARE PROVIDER ViaLennette Boyd, [place] [phone]

## 2018-12-03 ENCOUNTER — Telehealth: Payer: Self-pay | Admitting: Hematology

## 2018-12-03 NOTE — Telephone Encounter (Signed)
Tried to reach regarding schedule mailed °

## 2019-02-27 DIAGNOSIS — E782 Mixed hyperlipidemia: Secondary | ICD-10-CM | POA: Diagnosis not present

## 2019-02-27 DIAGNOSIS — B351 Tinea unguium: Secondary | ICD-10-CM | POA: Diagnosis not present

## 2019-02-27 DIAGNOSIS — Z79899 Other long term (current) drug therapy: Secondary | ICD-10-CM | POA: Diagnosis not present

## 2019-02-28 DIAGNOSIS — Z79899 Other long term (current) drug therapy: Secondary | ICD-10-CM | POA: Diagnosis not present

## 2019-03-01 IMAGING — MG BREAST SURGICAL SPECIMEN
1 series · 1 of 1 positions shown · non-contrast
Comparison: Previous exam(s).

CLINICAL DATA: Patient underwent 2 radioactive seed localizations
of the right breast prior to lumpectomy for ductal carcinoma in
situ.

EXAM:
SPECIMEN RADIOGRAPH OF THE RIGHT BREAST

[R]
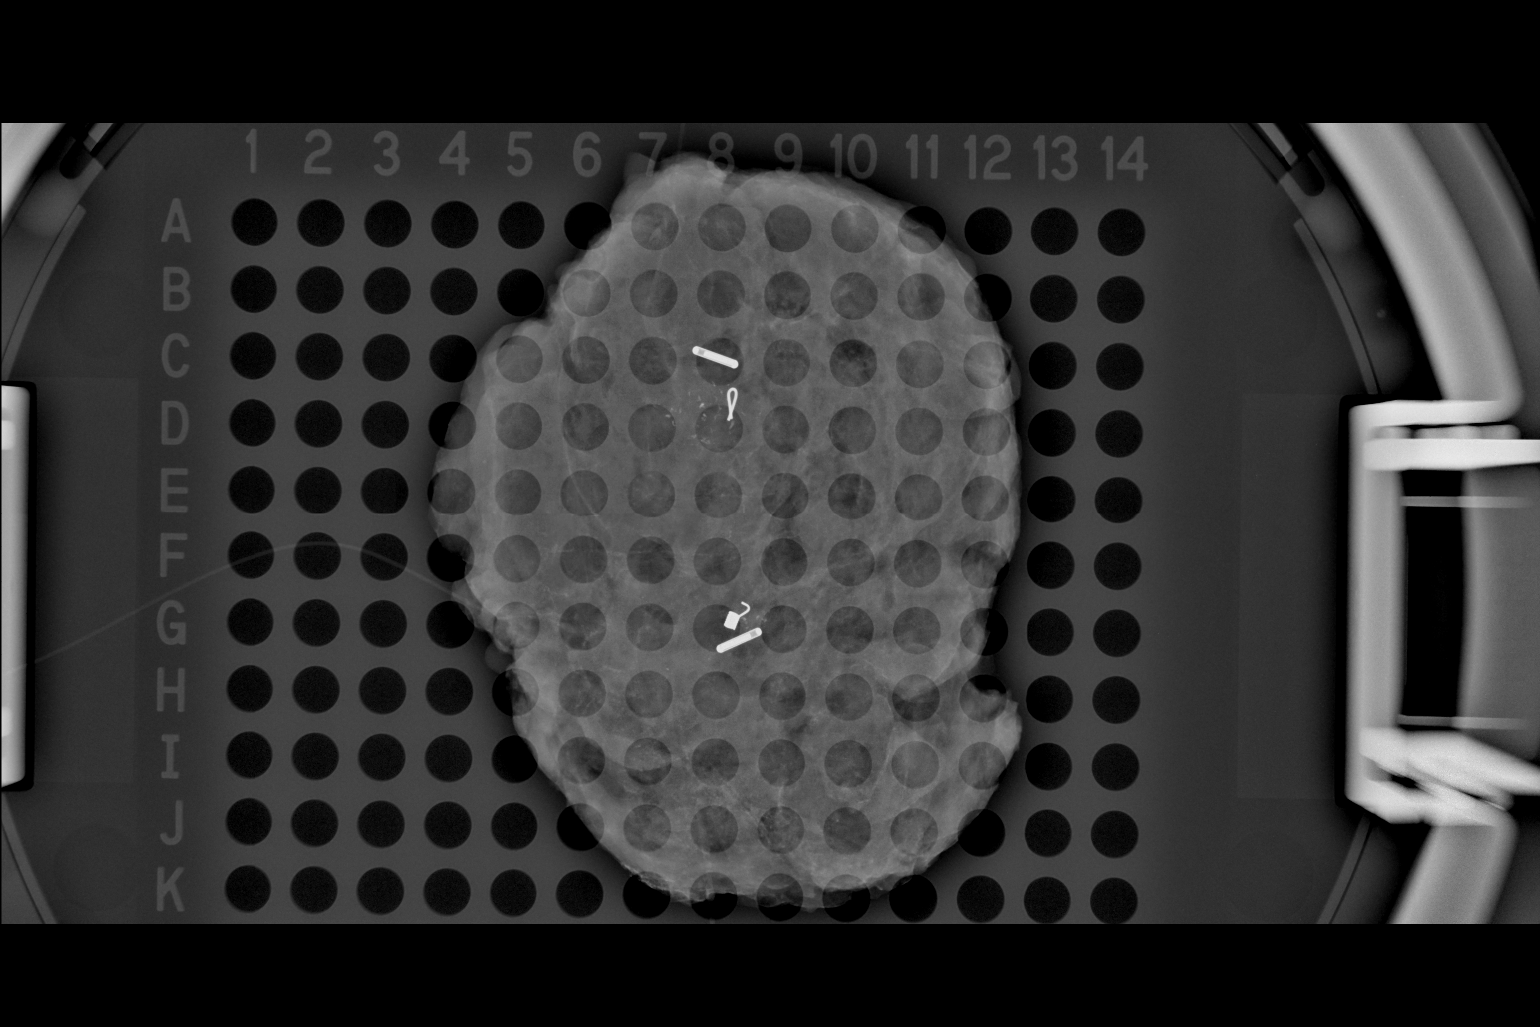

[1 of 1 positions shown; findings below may reference images not displayed]

FINDINGS: Status post excision of the right breast. Two radioactive seeds, a
coil shaped biopsy clip, and a ribbon shaped biopsy clip are
present, completely intact, and were marked for pathology. Numerous
calcifications are present within specimen.
IMPRESSION: Specimen radiograph of the right breast.

## 2019-03-06 ENCOUNTER — Ambulatory Visit: Payer: BLUE CROSS/BLUE SHIELD | Admitting: Hematology

## 2019-03-06 ENCOUNTER — Other Ambulatory Visit: Payer: BLUE CROSS/BLUE SHIELD

## 2019-03-10 ENCOUNTER — Telehealth: Payer: Self-pay | Admitting: Hematology

## 2019-03-10 NOTE — Telephone Encounter (Signed)
Changed appt per 8/09 sch message to phone call - spoke with patient and she is aware that she does not need to come in for labs and she will be called.

## 2019-03-12 ENCOUNTER — Inpatient Hospital Stay: Payer: BC Managed Care – PPO | Attending: Hematology | Admitting: Nurse Practitioner

## 2019-03-12 ENCOUNTER — Other Ambulatory Visit: Payer: BLUE CROSS/BLUE SHIELD

## 2019-03-12 ENCOUNTER — Telehealth: Payer: Self-pay | Admitting: Nurse Practitioner

## 2019-03-12 ENCOUNTER — Encounter: Payer: Self-pay | Admitting: Nurse Practitioner

## 2019-03-12 DIAGNOSIS — Z923 Personal history of irradiation: Secondary | ICD-10-CM | POA: Diagnosis not present

## 2019-03-12 DIAGNOSIS — D0511 Intraductal carcinoma in situ of right breast: Secondary | ICD-10-CM

## 2019-03-12 NOTE — Telephone Encounter (Signed)
Scheduled appt per 8/12 los ° °Spoke with patient and she is aware of her appt date and time. °

## 2019-03-12 NOTE — Progress Notes (Signed)
[place]   Telephone:([phone] Fax:([phone]   Clinic Follow up Note   Patient Care Team: Via, Lennette Bihari, MD as PCP - General (Family Medicine) Fanny Skates, MD as Consulting Physician (General Surgery) Truitt Merle, MD as Consulting Physician (Hematology) Kyung Rudd, MD as Consulting Physician (Radiation Oncology) 03/12/2019   I connected with Robin Boyd on 03/12/19 at 12:24 PM EDT by telephone visit and verified that I am speaking with the correct person using two identifiers.   I discussed the limitations, risks, security and privacy concerns of performing an evaluation and management service by telemedicine and the availability of in-person appointments. I also discussed with the patient that there may be a patient responsible charge related to this service. The patient expressed understanding and agreed to proceed.   Patient's location: Home Provider's location: [place] office  CHIEF COMPLAINT: f/u DCIS   SUMMARY OF ONCOLOGIC HISTORY: Oncology History Overview Note  Cancer Staging Ductal carcinoma in situ (DCIS) of right breast Staging form: Breast, AJCC 8th Edition - Clinical stage from 05/16/2018: Stage 0 (cTis (DCIS), cN0, cM0, ER+, PR-, HER2: Not Assessed) - Signed by Truitt Merle, MD on 05/21/2018     Ductal carcinoma in situ (DCIS) of right breast  05/08/2018 Breast US   05/08/2018 Breast US IMPRESSION: 1. Interval resolution of small mass in the UPPER portion of the RIGHT breast for which follow-up was recommended in 2017. 2. Two new areas of calcifications are suspicious for malignancy and biopsy is indicated. The likely sonographic correlates have been identified to facilitate biopsy options, given the presence of implants. We discussed the possibility and low likelihood of implant rupture at the time of biopsy. 3. No adenopathy by ultrasound.   05/08/2018 Mammogram   05/08/2018 Diagnostic Mammogram IMPRESSION: 1. Interval resolution of  small mass in the UPPER portion of the RIGHT breast for which follow-up was recommended in 2017. 2. Two new areas of calcifications are suspicious for malignancy and biopsy is indicated. The likely sonographic correlates have been identified to facilitate biopsy options, given the presence of implants. We discussed the possibility and low likelihood of implant rupture at the time of biopsy. 3. No adenopathy by ultrasound.   05/16/2018 Cancer Staging   Staging form: Breast, AJCC 8th Edition - Clinical stage from 05/16/2018: Stage 0 (cTis (DCIS), cN0, cM0, ER+, PR-, HER2: Not Assessed) - Signed by Truitt Merle, MD on 05/21/2018   05/16/2018 Receptors her2   05/16/2018 Surgical Pathology ADDITIONAL INFORMATION: 1. PROGNOSTIC INDICATORS Results: IMMUNOHISTOCHEMICAL AND MORPHOMETRIC ANALYSIS PERFORMED MANUALLY Estrogen Receptor: 50%, POSITIVE, STRONG STAINING INTENSITY Progesterone Receptor: 0%, NEGATIVE   05/16/2018 Pathology Results   Diagnosis 1. 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 2. Breast, right, needle core biopsy, 12:30 o'clock, 4cm from nipple - FOCAL HIGH GRADE DUCTAL CARCINOMA IN SITU.   05/20/2018 Initial Diagnosis   Ductal carcinoma in situ (DCIS) of right breast   06/10/2018 Surgery   Surgery 06/10/2018 Panel 1: Right BREAST LUMPECTOMY X2 WITH RADIOACTIVE SEED LOCALIZATION X2 with Fanny Skates, MD   06/10/2018 Cancer Staging   Staging form: Breast, AJCC 8th Edition - Pathologic stage from 06/10/2018: Stage Unknown (pTis (DCIS), pNX, cM0, G3, ER+, PR-, HER2: Not Assessed) - Signed by Truitt Merle, MD on 08/27/2018   07/15/2018 - 09/02/2018 Radiation Therapy    Radiation daily treatment with Dr. Lisbeth Renshaw    Radiation treatment dates:   07/15/2018 - 09/02/2018   Site/dose:  The patient initially received a dose of 50.4 Gy in 28 fractions to the right breast using  whole-breast tangent fields. This was delivered using a 3-D conformal technique. The patient then received a boost to the seroma. This delivered an additional 10 Gy in 5 fractions using 9E electrons with a special teletherapy technique. The total dose was 60.4 Gy.   Narrative: The patient tolerated radiation treatment relatively well.   The patient had some expected skin irritation with diffuse erythema and hyperpigmentation as she progressed during treatment. Moist desquamation was not present at the end of treatment. She is applying Radiaplex to her skin as ordered.    Anti-estrogen oral therapy   Patient declined Anastrozole      CURRENT THERAPY: Surveillance   INTERVAL HISTORY: Robin Boyd is doing well. She helps take care of 2 of her grandchildren. Appetite and energy level are normal. Denies new breast pain, mass or nipple change. Breast is still dark. effexor is helping hot flashes. She did not start anastrozole due to side effects. She prefers close observation. She denies pain, n/v or change in bowel habits, GI or vaginal bleeding, fever, chills, cough, chest pain, dyspnea, or lymphedema.    MEDICAL HISTORY:  Past Medical History:  Diagnosis Date  . Depression   . Hyperlipidemia     SURGICAL HISTORY: Past Surgical History:  Procedure Laterality Date  . ABDOMINAL HYSTERECTOMY  2012   ABDOMINAL HYSTERECTOMY WITH OVARIAN PRESERVATION - [place]   . AUGMENTATION MAMMAPLASTY  1610,    SILICONE  . BREAST LUMPECTOMY WITH RADIOACTIVE SEED LOCALIZATION Right 06/10/2018   Procedure: BREAST LUMPECTOMY X2 WITH RADIOACTIVE SEED LOCALIZATION X2;  Surgeon: Fanny Skates, MD;  Location: [facility];  Service: General;  Laterality: Right;  . BREAST SURGERY    . TUBAL LIGATION      I have reviewed the social history and family history with the patient and they are unchanged from previous note.  ALLERGIES:  has No Known Allergies.  MEDICATIONS:  Current Outpatient  Medications  Medication Sig Dispense Refill  . atorvastatin (LIPITOR) 40 MG tablet     . venlafaxine XR (EFFEXOR-XR) 150 MG 24 hr capsule TAKE 1 CAPSULE DAILY WITH BREAKFAST 30 capsule 11   No current facility-administered medications for this visit.     PHYSICAL EXAMINATION: No vital signs for this visit  Patient appears well over the phone. Speech is intact, non-pressured. Mood and affect appear normal.   LABORATORY DATA:  None for this visit     RADIOGRAPHIC STUDIES: I have personally reviewed the radiological images as listed and agreed with the findings in the report. No results found.   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 s/p lumpectomy in 05/2018 and adjuvant radiation from 05/2018 - 08/2018.  -she tolerated radiation well, continues to have skin discoloration -Robin Boyd is clinically doing well.  -She declined adjuvant AI due to side effect profile. I reviewed the potential benefit and rationale, she again declined. She prefers close observation and mammogram.  -she will be due annual mammogram in 05/2019. She will f/u with breast surgeon later this year and rad onc in Jan or Feb 2020. She will also see her PCP in 04/2019. -I encouraged her to continue self exam, eat well, exercise, and live healthy lifestyle.  -she continues effexor for hot flashes -She had normal DEXA in 2017, I encouraged her to take calcium and vitamin D.  -she knows  to call and seek in-person evaluation for any new concerns especially new breast mass, nipple change, new pain, fatigue, or unintentional weight loss.  -she will return for lab and breast exam in 4 months.   PLAN: -Mammogram due 05/2019 -Continue f/u with surgeon, rad onc, PCP as scheduled  -Lab and f/u with Dr. Burr Medico in 4 months  -Calcium, vitamin D  All questions were answered. The patient knows to call the clinic with any problems, questions or concerns. No  barriers to learning was detected. I spent 10 minutes non-face-to-face time in today's encounter.    Alla Feeling, NP 03/12/19

## 2019-03-31 DIAGNOSIS — E782 Mixed hyperlipidemia: Secondary | ICD-10-CM | POA: Diagnosis not present

## 2019-04-03 DIAGNOSIS — M25561 Pain in right knee: Secondary | ICD-10-CM | POA: Diagnosis not present

## 2019-04-03 DIAGNOSIS — Z0000 Encounter for general adult medical examination without abnormal findings: Secondary | ICD-10-CM | POA: Diagnosis not present

## 2019-04-03 DIAGNOSIS — B351 Tinea unguium: Secondary | ICD-10-CM | POA: Diagnosis not present

## 2019-04-03 DIAGNOSIS — E782 Mixed hyperlipidemia: Secondary | ICD-10-CM | POA: Diagnosis not present

## 2019-05-12 ENCOUNTER — Other Ambulatory Visit: Payer: Self-pay

## 2019-05-12 ENCOUNTER — Other Ambulatory Visit: Payer: Self-pay | Admitting: Adult Health

## 2019-05-12 ENCOUNTER — Ambulatory Visit
Admission: RE | Admit: 2019-05-12 | Discharge: 2019-05-12 | Disposition: A | Discharge status: Home / Self Care | Payer: BC Managed Care – PPO | Source: Ambulatory Visit | Attending: Adult Health | Admitting: Adult Health

## 2019-05-12 DIAGNOSIS — D0511 Intraductal carcinoma in situ of right breast: Secondary | ICD-10-CM

## 2019-05-12 DIAGNOSIS — R928 Other abnormal and inconclusive findings on diagnostic imaging of breast: Secondary | ICD-10-CM | POA: Diagnosis not present

## 2019-05-12 DIAGNOSIS — Z853 Personal history of malignant neoplasm of breast: Secondary | ICD-10-CM | POA: Diagnosis not present

## 2019-06-13 DIAGNOSIS — Z90710 Acquired absence of both cervix and uterus: Secondary | ICD-10-CM | POA: Diagnosis not present

## 2019-06-13 DIAGNOSIS — Z9882 Breast implant status: Secondary | ICD-10-CM | POA: Diagnosis not present

## 2019-06-13 DIAGNOSIS — C50211 Malignant neoplasm of upper-inner quadrant of right female breast: Secondary | ICD-10-CM | POA: Diagnosis not present

## 2019-07-07 ENCOUNTER — Telehealth: Payer: Self-pay | Admitting: Hematology

## 2019-07-07 NOTE — Telephone Encounter (Signed)
R/s appt per 12/5 sch messag e- pt is aware of new appt date and time   

## 2019-07-11 ENCOUNTER — Other Ambulatory Visit: Payer: BC Managed Care – PPO

## 2019-07-11 ENCOUNTER — Ambulatory Visit: Payer: BC Managed Care – PPO | Admitting: Hematology

## 2019-09-22 ENCOUNTER — Ambulatory Visit: Payer: Self-pay | Attending: Family

## 2019-09-22 DIAGNOSIS — Z23 Encounter for immunization: Secondary | ICD-10-CM | POA: Insufficient documentation

## 2019-09-22 NOTE — Progress Notes (Signed)
   Covid-19 Vaccination Clinic  Name:  Robin Boyd    MRN: [id number] DOB: September 21, 1955  09/22/2019  [name] was observed post Covid-19 immunization for 15 minutes without incidence. She was provided with Vaccine Information Sheet and instruction to access the V-Safe system.   [name] was instructed to call 911 with any severe reactions post vaccine: Marland Kitchen Difficulty breathing  . Swelling of your face and throat  . A fast heartbeat  . A bad rash all over your body  . Dizziness and weakness    Immunizations Administered    Name Date Dose VIS Date Route   Moderna COVID-19 Vaccine 09/22/2019 11:56 AM 0.5 mL 07/01/2019 Intramuscular   Manufacturer: Moderna   Lot: [id number]   [id number]

## 2019-10-03 DIAGNOSIS — G5702 Lesion of sciatic nerve, left lower limb: Secondary | ICD-10-CM | POA: Diagnosis not present

## 2019-10-03 DIAGNOSIS — M2242 Chondromalacia patellae, left knee: Secondary | ICD-10-CM | POA: Diagnosis not present

## 2019-10-21 ENCOUNTER — Ambulatory Visit: Payer: BC Managed Care – PPO | Attending: Family

## 2019-10-21 DIAGNOSIS — Z23 Encounter for immunization: Secondary | ICD-10-CM

## 2019-10-21 NOTE — Progress Notes (Signed)
   Covid-19 Vaccination Clinic  Name:  Janeen Penilla    MRN: [id number] DOB: [birth year]  10/21/2019  Ms. Philbert Riser was observed post Covid-19 immunization for 15 minutes without incident. She was provided with Vaccine Information Sheet and instruction to access the V-Safe system.   Ms. Philbert Riser was instructed to call 911 with any severe reactions post vaccine: Marland Kitchen Difficulty breathing  . Swelling of face and throat  . A fast heartbeat  . A bad rash all over body  . Dizziness and weakness   Immunizations Administered    Name Date Dose VIS Date Route   Moderna COVID-19 Vaccine 10/21/2019  3:35 PM 0.5 mL 07/01/2019 Intramuscular   Manufacturer: Moderna   LotMV:[id number]   [place]:[id number]

## 2019-11-19 ENCOUNTER — Other Ambulatory Visit: Payer: Self-pay | Admitting: Hematology

## 2019-11-24 ENCOUNTER — Other Ambulatory Visit: Payer: Self-pay

## 2019-11-24 DIAGNOSIS — D0511 Intraductal carcinoma in situ of right breast: Secondary | ICD-10-CM

## 2019-11-24 DIAGNOSIS — F3289 Other specified depressive episodes: Secondary | ICD-10-CM

## 2019-11-24 MED ORDER — VENLAFAXINE HCL ER 150 MG PO CP24: ORAL_CAPSULE | ORAL | 2 refills | Status: DC

## 2019-11-28 DIAGNOSIS — M9905 Segmental and somatic dysfunction of pelvic region: Secondary | ICD-10-CM | POA: Diagnosis not present

## 2019-11-28 DIAGNOSIS — M9902 Segmental and somatic dysfunction of thoracic region: Secondary | ICD-10-CM | POA: Diagnosis not present

## 2019-11-28 DIAGNOSIS — M9904 Segmental and somatic dysfunction of sacral region: Secondary | ICD-10-CM | POA: Diagnosis not present

## 2019-11-28 DIAGNOSIS — M9903 Segmental and somatic dysfunction of lumbar region: Secondary | ICD-10-CM | POA: Diagnosis not present

## 2019-12-01 DIAGNOSIS — M25562 Pain in left knee: Secondary | ICD-10-CM | POA: Diagnosis not present

## 2019-12-01 DIAGNOSIS — M238X2 Other internal derangements of left knee: Secondary | ICD-10-CM | POA: Diagnosis not present

## 2019-12-23 DIAGNOSIS — M25562 Pain in left knee: Secondary | ICD-10-CM | POA: Diagnosis not present

## 2020-01-08 ENCOUNTER — Other Ambulatory Visit: Payer: Self-pay | Admitting: Hematology

## 2020-01-08 NOTE — Progress Notes (Signed)
[facility]   [phone] Fax:(336) [phone]   Clinic Follow up Note   Patient Care Team: Cari Caraway, MD as PCP - General (Family Medicine) Fanny Skates, MD as Consulting Physician (General Surgery) Truitt Merle, MD as Consulting Physician (Hematology) Kyung Rudd, MD as Consulting Physician (Radiation Oncology)  Date of Service: 01/09/2020  CHIEF COMPLAINT: F/u of Right breast DCIS  SUMMARY OF ONCOLOGIC HISTORY: Oncology History Overview Note  Cancer Staging Ductal carcinoma in situ (DCIS) of right breast Staging form: Breast, AJCC 8th Edition - Clinical stage from 05/16/2018: Stage 0 (cTis (DCIS), cN0, cM0, ER+, PR-, HER2: Not Assessed) - Signed by Truitt Merle, MD on 05/21/2018     Ductal carcinoma in situ (DCIS) of right breast  05/08/2018 Breast US   05/08/2018 Breast US IMPRESSION: 1. Interval resolution of small mass in the UPPER portion of the RIGHT breast for which follow-up was recommended in 2017. 2. Two new areas of calcifications are suspicious for malignancy and biopsy is indicated. The likely sonographic correlates have been identified to facilitate biopsy options, given the presence of implants. We discussed the possibility and low likelihood of implant rupture at the time of biopsy. 3. No adenopathy by ultrasound.   05/08/2018 Mammogram   05/08/2018 Diagnostic Mammogram IMPRESSION: 1. Interval resolution of small mass in the UPPER portion of the RIGHT breast for which follow-up was recommended in 2017. 2. Two new areas of calcifications are suspicious for malignancy and biopsy is indicated. The likely sonographic correlates have been identified to facilitate biopsy options, given the presence of implants. We discussed the possibility and low likelihood of implant rupture at the time of biopsy. 3. No adenopathy by ultrasound.   05/16/2018 Cancer Staging   Staging form: Breast, AJCC 8th Edition - Clinical stage from 05/16/2018:  Stage 0 (cTis (DCIS), cN0, cM0, ER+, PR-, HER2: Not Assessed) - Signed by Truitt Merle, MD on 05/21/2018   05/16/2018 Receptors her2   05/16/2018 Surgical Pathology ADDITIONAL INFORMATION: 1. PROGNOSTIC INDICATORS Results: IMMUNOHISTOCHEMICAL AND MORPHOMETRIC ANALYSIS PERFORMED MANUALLY Estrogen Receptor: 50%, POSITIVE, STRONG STAINING INTENSITY Progesterone Receptor: 0%, NEGATIVE   05/16/2018 Pathology Results   Diagnosis 1. 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 2. Breast, right, needle core biopsy, 12:30 o'clock, 4cm from nipple - FOCAL HIGH GRADE DUCTAL CARCINOMA IN SITU.   05/20/2018 Initial Diagnosis   Ductal carcinoma in situ (DCIS) of right breast   06/10/2018 Surgery   Surgery 06/10/2018 Panel 1: Right BREAST LUMPECTOMY X2 WITH RADIOACTIVE SEED LOCALIZATION X2 with Fanny Skates, MD   06/10/2018 Cancer Staging   Staging form: Breast, AJCC 8th Edition - Pathologic stage from 06/10/2018: Stage Unknown (pTis (DCIS), pNX, cM0, G3, ER+, PR-, HER2: Not Assessed) - Signed by Truitt Merle, MD on 08/27/2018   07/15/2018 - 09/02/2018 Radiation Therapy    Radiation daily treatment with Dr. Lisbeth Renshaw    Radiation treatment dates:   07/15/2018 - 09/02/2018   Site/dose:   The patient initially received a dose of 50.4 Gy in 28 fractions to the right breast using whole-breast tangent fields. This was delivered using a 3-D conformal technique. The patient then received a boost to the seroma. This delivered an additional 10 Gy in 5 fractions using 9E electrons with a special teletherapy technique. The total dose was 60.4 Gy.   Narrative: The patient tolerated radiation treatment relatively well.   The patient had some expected skin irritation with diffuse erythema  and hyperpigmentation as she progressed during treatment. Moist desquamation was not present at the end of treatment. She is applying  Radiaplex to her skin as ordered.    Anti-estrogen oral therapy   Patient declined Anastrozole       CURRENT THERAPY:  Surveillance  INTERVAL HISTORY:  Robin Boyd is here for a follow up of right breast DCIS. She was last seen by me 6 months ago and seen by NP Lacie in interim. She presents to the clinic alone.  She is clinically doing well, denies any concerns in her breasts.  Her main concern is her weight gain, she has gained about 10 pounds in the past 1 year.  She is physically active, watching her to grandchildren at home.  She does not exercise regularly.  She is on Effexor, which helps her hot flashes.  No other concerns.  Review of system otherwise negative.   MEDICAL HISTORY:  Past Medical History:  Diagnosis Date  . Depression   . Hyperlipidemia     SURGICAL HISTORY: Past Surgical History:  Procedure Laterality Date  . ABDOMINAL HYSTERECTOMY  2012   ABDOMINAL HYSTERECTOMY WITH OVARIAN PRESERVATION - [place]   . AUGMENTATION MAMMAPLASTY  7414,    SILICONE  . BREAST LUMPECTOMY WITH RADIOACTIVE SEED LOCALIZATION Right 06/10/2018   Procedure: BREAST LUMPECTOMY X2 WITH RADIOACTIVE SEED LOCALIZATION X2;  Surgeon: Fanny Skates, MD;  Location: [facility];  Service: General;  Laterality: Right;  . BREAST SURGERY    . TUBAL LIGATION      I have reviewed the social history and family history with the patient and they are unchanged from previous note.  ALLERGIES:  has No Known Allergies.  MEDICATIONS:  Current Outpatient Medications  Medication Sig Dispense Refill  . atorvastatin (LIPITOR) 40 MG tablet     . venlafaxine XR (EFFEXOR-XR) 150 MG 24 hr capsule TAKE 1 CAPSULE DAILY WITH BREAKFAST 30 capsule 2   No current facility-administered medications for this visit.    PHYSICAL EXAMINATION: ECOG PERFORMANCE STATUS: 0 - Asymptomatic  Vitals:   01/09/20 1440  BP: 128/87  Pulse: 84  Resp: 17  Temp: 97.7 F (36.5 C)  SpO2: 99%   Filed  Weights   01/09/20 1440  Weight: 166 lb (75.3 kg)    GENERAL:alert, no distress and comfortable SKIN: skin color, texture, turgor are normal, no rashes or significant lesions EYES: normal, Conjunctiva are pink and non-injected, sclera clear NECK: supple, thyroid normal size, non-tender, without nodularity LYMPH:  no palpable lymphadenopathy in the cervical, axillary  LUNGS: clear to auscultation and percussion with normal breathing effort HEART: regular rate & rhythm and no murmurs and no lower extremity edema ABDOMEN:abdomen soft, non-tender and normal bowel sounds Musculoskeletal:no cyanosis of digits and no clubbing  NEURO: alert & oriented x 3 with fluent speech, no focal motor/sensory deficits Breasts: Breast inspection showed them to be symmetrical with no nipple discharge. Palpation of the breasts and axilla revealed no obvious mass that I could appreciate.   LABORATORY DATA:  I have reviewed the data as listed CBC Latest Ref Rng & Units 05/22/2018 09/25/2017 12/22/2015  WBC 4.0 - 10.5 K/uL 6.1 19.8(H) 4.8  Hemoglobin 12.0 - 15.0 g/dL 13.4 14.6 13.2  Hematocrit 36 - 46 % 42.1 46.0 40.6  Platelets 150 - 400 K/uL 253 427(H) 254     CMP Latest Ref Rng & Units 05/22/2018 09/25/2017 07/10/2016  Glucose 70 - 99 mg/dL 88 153(H) -  BUN 6 - 20 mg/dL 11 23(H) -  Creatinine 0.44 - 1.00 mg/dL 0.94 0.92 -  Sodium 135 - 145 mmol/L 143 139 -  Potassium 3.5 - 5.1 mmol/L 4.1 4.7 -  Chloride 98 - 111 mmol/L 106 104 -  CO2 22 - 32 mmol/L 25 24 -  Calcium 8.9 - 10.3 mg/dL 9.8 8.5(L) -  Total Protein 6.5 - 8.1 g/dL 7.8 7.0 -  Total Bilirubin 0.3 - 1.2 mg/dL 0.5 0.4 -  Alkaline Phos 38 - 126 U/L 48 84 -  AST 15 - 41 U/L _0 ALT 0 - 44 U/L _1 RADIOGRAPHIC STUDIES: I have personally reviewed the radiological images as listed and agreed with the findings in the report. No results found.   ASSESSMENT & PLAN:  Ameliana Brashear is a 64 y.o. 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 Her DCIS was cured by complete surgical resection with right breast lumpectomy in 05/2018. Surgical margins were negative. She completed adjuvant Radiation  -Given her high grade disease, and positive ER, I recommended antiestrogen therapy with Anastrozole, which decrease her risk of future breast cancer. She declined  -She is clinically doing well, breast exam was negative today. -Continue surveillance given her high risk.  I recommend annual mammogram and screening breast MRI, she is agreeable  -f/u in one year   2. Hot Flashes, Mood swings -secondary to menopause, she was on hormonal replacement, which she stopped when she was diagnosed with breast cancer. -Currently on Effexor 88m dialy.    3. Bone health  -Last DEXA in 11/2015 was normal through her Gyn  -She is overdue for DEXA, I encourage her to repeat DEXA at her GYN office     PLAN: -Screening breast MRI in the next months -Diagnostic mammogram in October 2021   No problem-specific Assessment & Plan notes found for this encounter.   Orders Placed This Encounter  Procedures  . MR BREAST BILATERAL W WO CONTRAST INC CAD    Standing Status:   Future    Standing Expiration Date:   01/08/2021    Order Specific Question:   If indicated for the ordered procedure, I authorize the administration of contrast media per Radiology protocol    Answer:   Yes    Order Specific Question:   What is the patient's sedation requirement?    Answer:   No Sedation    Order Specific Question:   Does the patient have a pacemaker or implanted devices?    Answer:   No    Order Specific Question:   Radiology Contrast Protocol - do NOT remove file path    Answer:   [email or web address]    Order Specific Question:   Preferred imaging location?    Answer:   [facility] (table limit-550lbs)  . MM DIAG BREAST W/IMPLANT TOMO BILATERAL    Standing Status:    Future    Standing Expiration Date:   01/10/2021    Order Specific Question:   Reason for Exam (SYMPTOM  OR DIAGNOSIS REQUIRED)    Answer:   SCREENING    Order Specific Question:   Preferred imaging location?    Answer[facility]  All questions were answered. The patient knows to call the clinic with any problems, questions or concerns. No barriers to learning was detected. The total time spent in the appointment was 25 minutes.     YTruitt Merle MD 01/09/2020  INormajean Baxter  Richardson Landry, am acting as scribe for Truitt Merle, MD.   I have reviewed the above documentation for accuracy and completeness, and I agree with the above.

## 2020-01-09 ENCOUNTER — Inpatient Hospital Stay: Payer: BC Managed Care – PPO

## 2020-01-09 ENCOUNTER — Inpatient Hospital Stay: Payer: BC Managed Care – PPO | Attending: Hematology | Admitting: Hematology

## 2020-01-09 ENCOUNTER — Telehealth: Payer: Self-pay | Admitting: Hematology

## 2020-01-09 ENCOUNTER — Other Ambulatory Visit: Payer: Self-pay

## 2020-01-09 VITALS — BP 128/87 | HR 84 | Temp 97.7°F | Resp 17 | Ht 63.0 in | Wt 166.0 lb

## 2020-01-09 DIAGNOSIS — Z170 Estrogen receptor positive status [ER+]: Secondary | ICD-10-CM | POA: Insufficient documentation

## 2020-01-09 DIAGNOSIS — Z79899 Other long term (current) drug therapy: Secondary | ICD-10-CM | POA: Insufficient documentation

## 2020-01-09 DIAGNOSIS — Z923 Personal history of irradiation: Secondary | ICD-10-CM | POA: Diagnosis not present

## 2020-01-09 DIAGNOSIS — D0511 Intraductal carcinoma in situ of right breast: Secondary | ICD-10-CM | POA: Diagnosis not present

## 2020-01-09 DIAGNOSIS — Z1239 Encounter for other screening for malignant neoplasm of breast: Secondary | ICD-10-CM | POA: Diagnosis not present

## 2020-01-09 DIAGNOSIS — E785 Hyperlipidemia, unspecified: Secondary | ICD-10-CM | POA: Diagnosis not present

## 2020-01-09 DIAGNOSIS — F329 Major depressive disorder, single episode, unspecified: Secondary | ICD-10-CM | POA: Insufficient documentation

## 2020-01-09 NOTE — Telephone Encounter (Signed)
Patient stated she will call to get her f/u scheduled for next year, due to insurance purposes.

## 2020-01-11 ENCOUNTER — Encounter: Payer: Self-pay | Admitting: Hematology

## 2020-01-21 ENCOUNTER — Other Ambulatory Visit: Payer: Self-pay | Admitting: *Deleted

## 2020-01-21 MED ORDER — VENLAFAXINE HCL ER 75 MG PO CP24: 75.0000 mg | ORAL_CAPSULE | Freq: Every day | ORAL | 1 refills | Status: AC

## 2020-01-21 NOTE — Telephone Encounter (Signed)
Pt spoke with Dr Burr Medico about tapering off Effexor last week. Pt had stopped, but Dr Burr Medico told her to decrease dose. Rx sent to [facility] for 75 mg as discussed by Dr Burr Medico on 6/11. Pt was taking 150mg .

## 2020-01-31 IMAGING — MG MM  DIGITAL DIAGNOSTIC BREAST BILAT IMPLANT W/ TOMO W/ CAD
8 of 13 series · 8 of 29 positions shown · non-contrast
Comparison: Previous exam(s).

CLINICAL DATA: Status post right lumpectomy and radiation therapy
for breast cancer in 2275.

EXAM:
2D DIGITAL DIAGNOSTIC BILATERAL MAMMOGRAM WITH IMPLANTS, CAD AND
ADJUNCT TOMO
The patient has retropectoral implants. Standard and implant
displaced views were performed.

[L MLO]
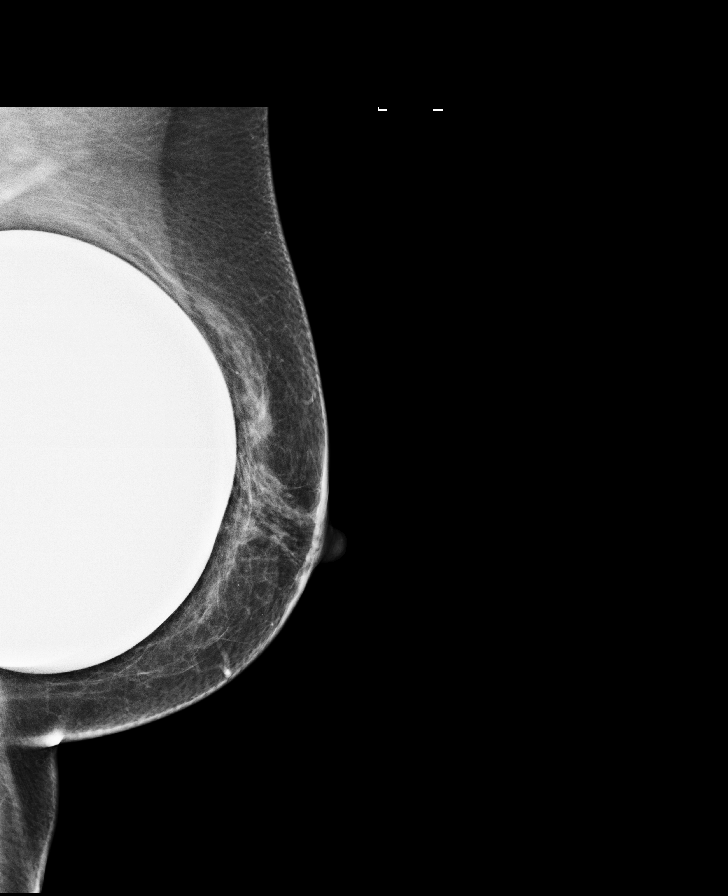

[R MLO (1 of 2)]
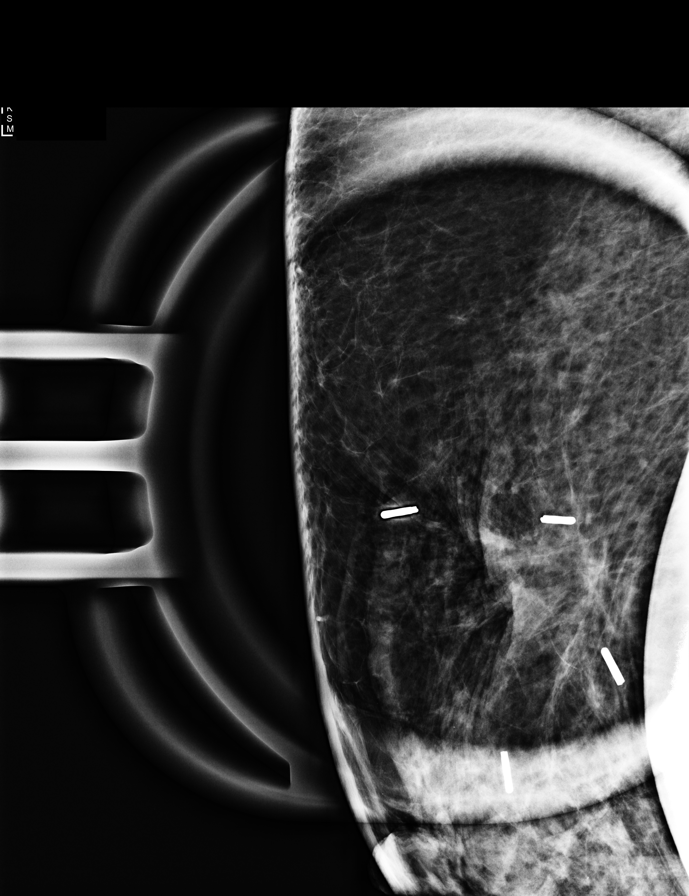

[R MLO (2 of 2)]
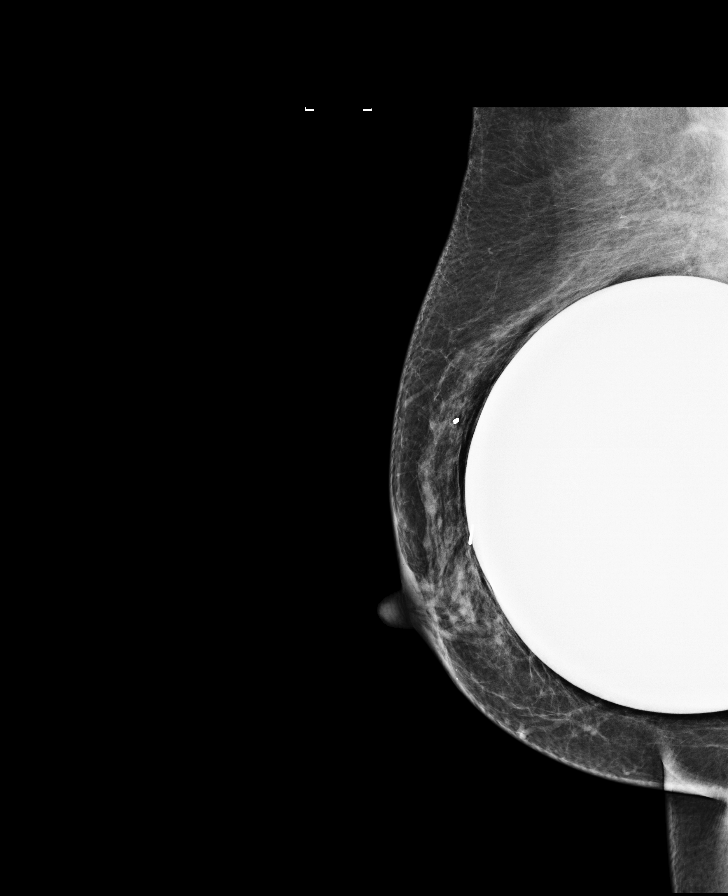

[L CC]
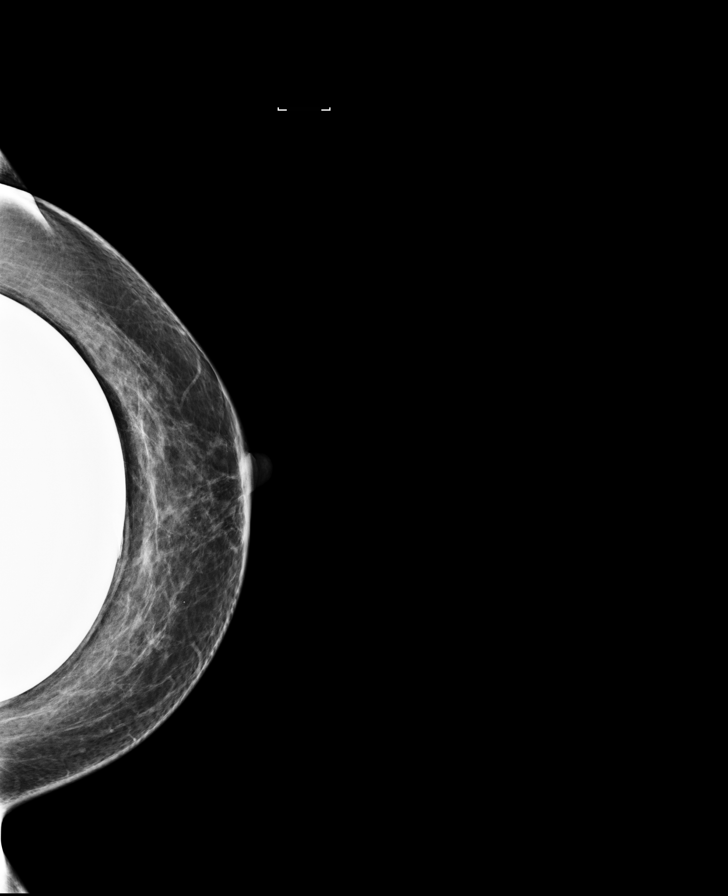

[R CC]
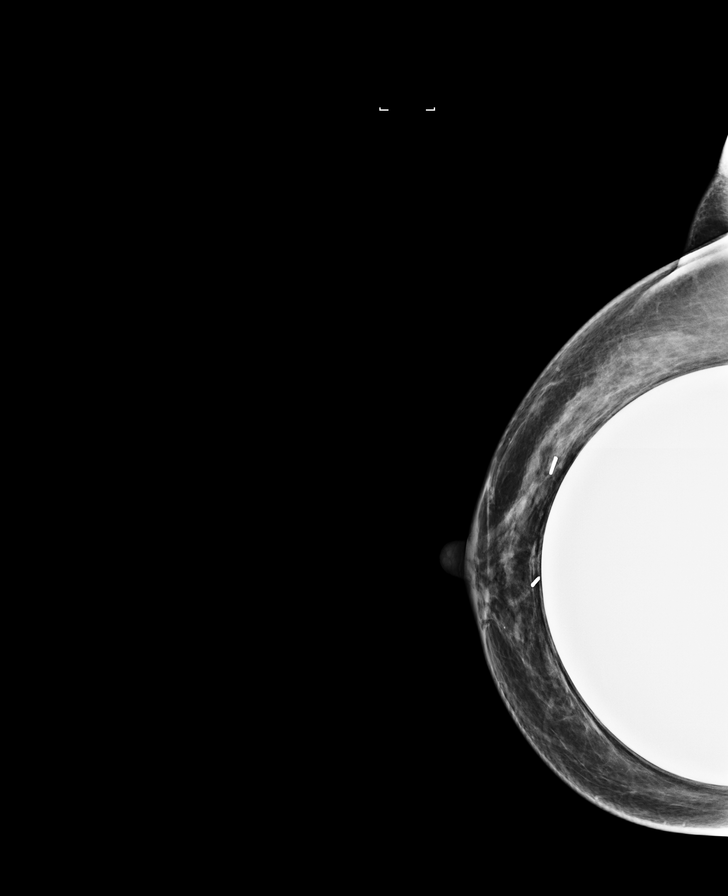

[L CC synth-2D]
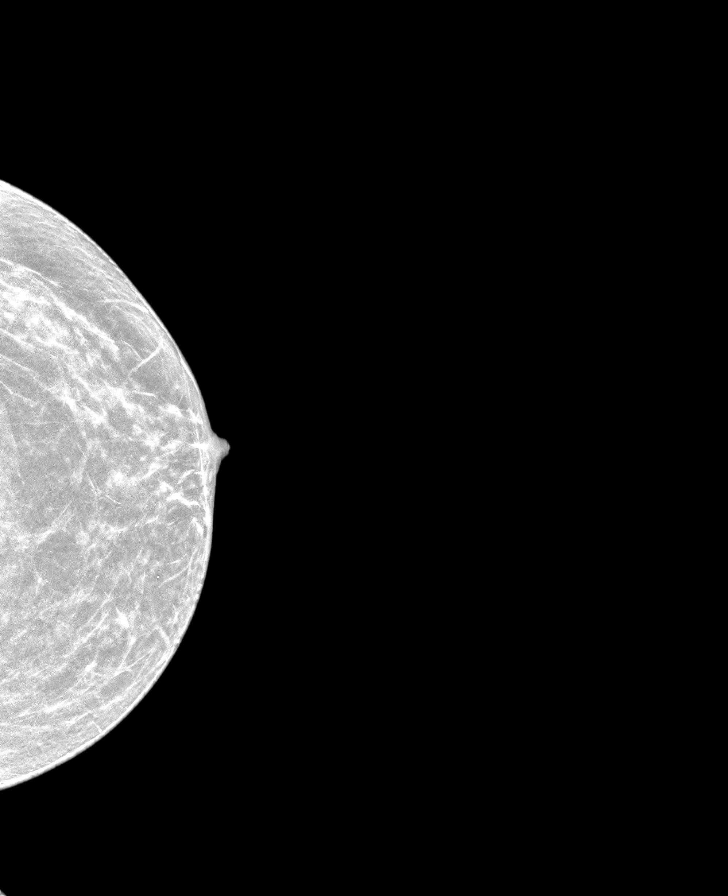

[R MLO synth-2D]
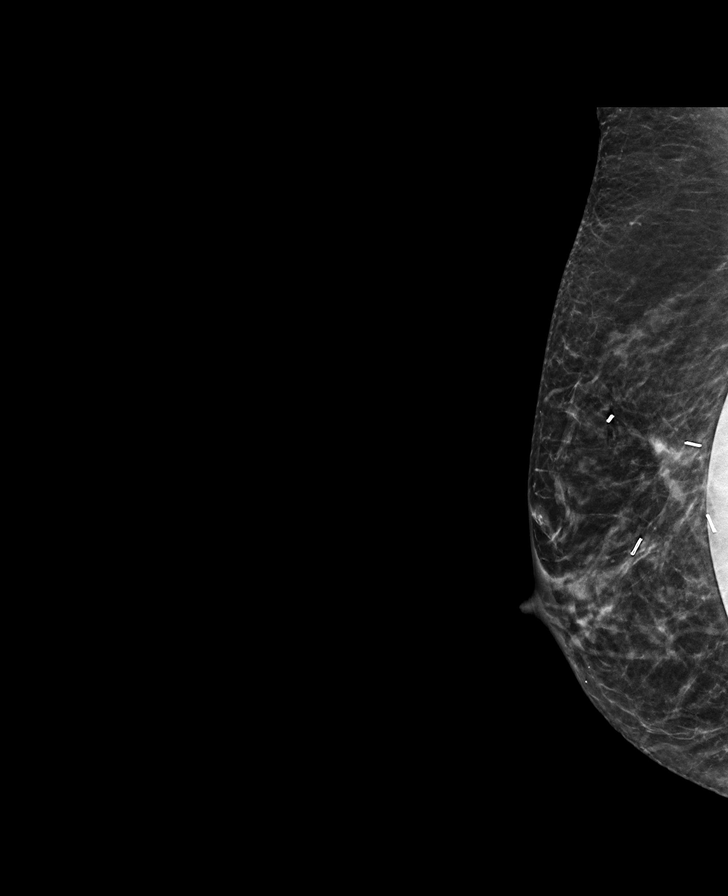

[L MLO synth-2D]
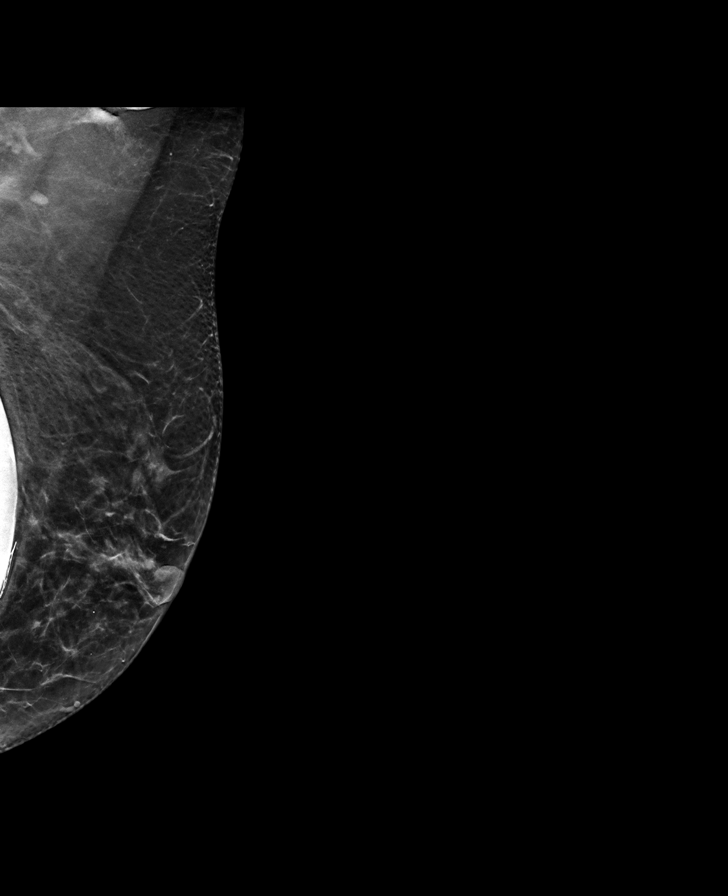

[8 of 29 positions shown; findings below may reference images not displayed]

ACR Breast Density Category b: There are scattered areas of
fibroglandular density.
FINDINGS: Interval post lumpectomy changes on the right with no findings
suspicious for malignancy in either breast. There are no residual
calcifications in the region of the lumpectomy bed.

Mammographic images were processed with CAD.
IMPRESSION: No evidence of malignancy.

RECOMMENDATION:
Bilateral diagnostic mammogram in 1 year.

I have discussed the findings and recommendations with the patient.
If applicable, a reminder letter will be sent to the patient
regarding the next appointment.

BI-RADS CATEGORY  2: Benign.

## 2020-02-09 DIAGNOSIS — H35372 Puckering of macula, left eye: Secondary | ICD-10-CM | POA: Diagnosis not present

## 2020-04-09 DIAGNOSIS — M25561 Pain in right knee: Secondary | ICD-10-CM | POA: Diagnosis not present

## 2020-04-09 DIAGNOSIS — Z0000 Encounter for general adult medical examination without abnormal findings: Secondary | ICD-10-CM | POA: Diagnosis not present

## 2020-04-09 DIAGNOSIS — E782 Mixed hyperlipidemia: Secondary | ICD-10-CM | POA: Diagnosis not present

## 2020-04-09 DIAGNOSIS — Z23 Encounter for immunization: Secondary | ICD-10-CM | POA: Diagnosis not present

## 2020-04-09 DIAGNOSIS — C50911 Malignant neoplasm of unspecified site of right female breast: Secondary | ICD-10-CM | POA: Diagnosis not present

## 2020-04-09 DIAGNOSIS — N951 Menopausal and female climacteric states: Secondary | ICD-10-CM | POA: Diagnosis not present

## 2020-04-13 ENCOUNTER — Other Ambulatory Visit: Payer: Self-pay | Admitting: Hematology

## 2020-04-13 ENCOUNTER — Other Ambulatory Visit: Payer: Self-pay | Admitting: Family Medicine

## 2020-04-13 DIAGNOSIS — E2839 Other primary ovarian failure: Secondary | ICD-10-CM

## 2020-04-26 DIAGNOSIS — M25562 Pain in left knee: Secondary | ICD-10-CM | POA: Diagnosis not present

## 2020-05-11 DIAGNOSIS — M25562 Pain in left knee: Secondary | ICD-10-CM | POA: Diagnosis not present

## 2020-05-12 ENCOUNTER — Other Ambulatory Visit: Payer: Self-pay

## 2020-05-12 ENCOUNTER — Ambulatory Visit
Admission: RE | Admit: 2020-05-12 | Discharge: 2020-05-12 | Disposition: A | Discharge status: Home / Self Care | Payer: BC Managed Care – PPO | Source: Ambulatory Visit | Attending: Hematology | Admitting: Hematology

## 2020-05-12 DIAGNOSIS — D0511 Intraductal carcinoma in situ of right breast: Secondary | ICD-10-CM

## 2020-05-12 DIAGNOSIS — R922 Inconclusive mammogram: Secondary | ICD-10-CM | POA: Diagnosis not present

## 2020-05-17 DIAGNOSIS — M25561 Pain in right knee: Secondary | ICD-10-CM | POA: Diagnosis not present

## 2020-05-17 DIAGNOSIS — M25562 Pain in left knee: Secondary | ICD-10-CM | POA: Diagnosis not present

## 2020-06-08 DIAGNOSIS — M25561 Pain in right knee: Secondary | ICD-10-CM | POA: Diagnosis not present

## 2020-06-11 ENCOUNTER — Ambulatory Visit: Payer: BC Managed Care – PPO | Admitting: Plastic Surgery

## 2020-06-11 ENCOUNTER — Encounter: Payer: Self-pay | Admitting: Plastic Surgery

## 2020-06-11 ENCOUNTER — Other Ambulatory Visit: Payer: Self-pay

## 2020-06-11 DIAGNOSIS — D239 Other benign neoplasm of skin, unspecified: Secondary | ICD-10-CM | POA: Diagnosis not present

## 2020-06-11 NOTE — Progress Notes (Signed)
     Patient ID: Robin Boyd, female    DOB: [birth year], 64 y.o.   MRN: [id number]   Chief Complaint  Patient presents with  . Skin Problem    The patient is a 64 year old female here for evaluation of her left leg skin lesion.  Looks most likely like a dermatofibroma and was evaluated by dermatologist as well.  She is otherwise in good health.  The lesion is located on the anterior mid aspect of the left upper leg.  It is 1.5 cm and firm.  It has the consistency of a dermatofibroma.  No other concerning skin lesions are noted.  The patient does not have a history of hypertrophic or keloid scarring.   Review of Systems  Constitutional: Negative.   HENT: Negative.   Eyes: Negative.   Respiratory: Negative.   Cardiovascular: Negative.   Endocrine: Negative.   Genitourinary: Negative.   Neurological: Negative.     Past Medical History:  Diagnosis Date  . Depression   . Hyperlipidemia     Past Surgical History:  Procedure Laterality Date  . ABDOMINAL HYSTERECTOMY  2012   ABDOMINAL HYSTERECTOMY WITH OVARIAN PRESERVATION - [place]   . AUGMENTATION MAMMAPLASTY  6270,    SILICONE  . BREAST LUMPECTOMY WITH RADIOACTIVE SEED LOCALIZATION Right 06/10/2018   Procedure: BREAST LUMPECTOMY X2 WITH RADIOACTIVE SEED LOCALIZATION X2;  Surgeon: Fanny Skates, MD;  Location: [facility];  Service: General;  Laterality: Right;  . BREAST SURGERY    . TUBAL LIGATION        Current Outpatient Medications:  .  atorvastatin (LIPITOR) 40 MG tablet, , Disp: , Rfl:  .  venlafaxine XR (EFFEXOR-XR) 75 MG 24 hr capsule, Take 1 capsule (75 mg total) by mouth daily with breakfast., Disp: 30 capsule, Rfl: 1   Objective:   Vitals:   06/11/20 1332  BP: 119/75  Pulse: 84  Temp: 98.1 F (36.7 C)  SpO2: 100%    Physical Exam Vitals and nursing note reviewed.  Constitutional:      Appearance: Normal appearance.  HENT:     Head: Normocephalic and atraumatic.  Cardiovascular:      Rate and Rhythm: Normal rate.     Pulses: Normal pulses.  Pulmonary:     Effort: Pulmonary effort is normal. No respiratory distress.  Musculoskeletal:       Legs:  Neurological:     General: No focal deficit present.     Mental Status: She is alert and oriented to person, place, and time.  Psychiatric:        Mood and Affect: Mood normal.        Behavior: Behavior normal.        Thought Content: Thought content normal.     Assessment & Plan:  Dermatofibroma Plan for office excision of left leg dermatofibroma.  Pictures were obtained of the patient and placed in the chart with the patient's or guardian's permission.   [place], DO

## 2020-06-15 DIAGNOSIS — M25561 Pain in right knee: Secondary | ICD-10-CM | POA: Diagnosis not present

## 2020-06-17 DIAGNOSIS — M25561 Pain in right knee: Secondary | ICD-10-CM | POA: Diagnosis not present

## 2020-06-18 DIAGNOSIS — Z20822 Contact with and (suspected) exposure to covid-19: Secondary | ICD-10-CM | POA: Diagnosis not present

## 2020-06-21 DIAGNOSIS — E782 Mixed hyperlipidemia: Secondary | ICD-10-CM | POA: Diagnosis not present

## 2020-06-21 DIAGNOSIS — M25561 Pain in right knee: Secondary | ICD-10-CM | POA: Diagnosis not present

## 2020-07-12 DIAGNOSIS — S83206A Unspecified tear of unspecified meniscus, current injury, right knee, initial encounter: Secondary | ICD-10-CM | POA: Diagnosis not present

## 2020-07-12 DIAGNOSIS — S83242D Other tear of medial meniscus, current injury, left knee, subsequent encounter: Secondary | ICD-10-CM | POA: Diagnosis not present

## 2020-07-12 DIAGNOSIS — M25561 Pain in right knee: Secondary | ICD-10-CM | POA: Diagnosis not present

## 2020-07-26 ENCOUNTER — Other Ambulatory Visit: Payer: Self-pay

## 2020-07-26 ENCOUNTER — Other Ambulatory Visit (HOSPITAL_COMMUNITY)
Admission: RE | Admit: 2020-07-26 | Discharge: 2020-07-26 | Disposition: A | Discharge status: Home / Self Care | Payer: BC Managed Care – PPO | Source: Ambulatory Visit | Attending: Plastic Surgery | Admitting: Plastic Surgery

## 2020-07-26 ENCOUNTER — Ambulatory Visit: Payer: BC Managed Care – PPO | Admitting: Plastic Surgery

## 2020-07-26 ENCOUNTER — Encounter: Payer: Self-pay | Admitting: Plastic Surgery

## 2020-07-26 ENCOUNTER — Other Ambulatory Visit: Payer: BC Managed Care – PPO

## 2020-07-26 VITALS — BP 128/83 | HR 84 | Temp 97.8°F

## 2020-07-26 DIAGNOSIS — L989 Disorder of the skin and subcutaneous tissue, unspecified: Secondary | ICD-10-CM | POA: Insufficient documentation

## 2020-07-26 DIAGNOSIS — D2372 Other benign neoplasm of skin of left lower limb, including hip: Secondary | ICD-10-CM | POA: Diagnosis not present

## 2020-07-26 NOTE — Progress Notes (Signed)
Procedure Note  Preoperative Dx: Changing skin lesion left thigh  Postoperative Dx: Same  Procedure: Excision of changing skin lesion left thigh possible dermatofibroma 1 cm  Anesthesia: Lidocaine 1% with 1:100,000 epinepherine   Description of Procedure: Risks and complications were explained to the patient.  Consent was confirmed and the patient understands the risks and benefits.  The potential complications and alternatives were explained and the patient consents.  The patient expressed understanding the option of not having the procedure and the risks of a scar.  Time out was called and all information was confirmed to be correct.    The area was prepped and drapped.  Lidocaine 1% with epinepherine was injected in the subcutaneous area.  After waiting several minutes for the local to take affect a #15 blade was used to excise the area in an eliptical pattern.  A 5-0 Monocryl was used to close the deep layers with simple interrupted stitches.  The skin edges were reapproximated with 5-0 Monocryl subcuticular running closure.  A dressing was applied.  The patient was given instructions on how to care for the area and a follow up appointment.  Denice tolerated the procedure well and there were no complications. The specimen was sent to pathology.

## 2020-07-30 ENCOUNTER — Ambulatory Visit: Payer: BC Managed Care – PPO | Admitting: Plastic Surgery

## 2020-08-02 LAB — SURGICAL PATHOLOGY

## 2020-08-10 ENCOUNTER — Encounter: Payer: Self-pay | Admitting: Plastic Surgery

## 2020-08-10 ENCOUNTER — Ambulatory Visit (INDEPENDENT_AMBULATORY_CARE_PROVIDER_SITE_OTHER): Payer: BC Managed Care – PPO | Admitting: Plastic Surgery

## 2020-08-10 ENCOUNTER — Other Ambulatory Visit: Payer: Self-pay

## 2020-08-10 VITALS — BP 132/79 | HR 88 | Temp 96.9°F

## 2020-08-10 DIAGNOSIS — M25562 Pain in left knee: Secondary | ICD-10-CM | POA: Diagnosis not present

## 2020-08-10 DIAGNOSIS — D239 Other benign neoplasm of skin, unspecified: Secondary | ICD-10-CM

## 2020-08-10 NOTE — Progress Notes (Signed)
Patient is a 65 year old female here for follow-up after undergoing excision of changing skin lesion on her left thigh possible dermatofibroma (1 cm) on 07/26/2020 with Dr. Marla Roe.  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  Cellular dermatofibroma's can recur.  This lesion extends to the deep aspect of the specimen.  ~ 2 weeks PO Patient reports she is doing well.  No complaints.  Denies fever/chills, nausea/vomiting, or pain.  Incision is healing very nicely, C/D/I.  No signs of infection, redness, drainage.  Sutures were removed today.  She may apply Vaseline to the incision as needed for moisture.  Starting tomorrow she may use any desired scar cream.  Follow-up as needed.  Call office for any questions/concerns. Return precautions provided   Pictures were obtained of the patient and placed in the chart with the patient's or guardian's permission.

## 2020-08-25 DIAGNOSIS — S83241D Other tear of medial meniscus, current injury, right knee, subsequent encounter: Secondary | ICD-10-CM | POA: Diagnosis not present

## 2021-01-26 ENCOUNTER — Ambulatory Visit
Admission: RE | Admit: 2021-01-26 | Discharge: 2021-01-26 | Disposition: A | Discharge status: Home / Self Care | Payer: BC Managed Care – PPO | Source: Ambulatory Visit | Attending: Family Medicine | Admitting: Family Medicine

## 2021-01-26 ENCOUNTER — Other Ambulatory Visit: Payer: Self-pay

## 2021-01-26 DIAGNOSIS — E2839 Other primary ovarian failure: Secondary | ICD-10-CM

## 2021-01-26 DIAGNOSIS — Z780 Asymptomatic menopausal state: Secondary | ICD-10-CM | POA: Diagnosis not present

## 2021-04-19 DIAGNOSIS — L659 Nonscarring hair loss, unspecified: Secondary | ICD-10-CM | POA: Diagnosis not present

## 2021-04-19 DIAGNOSIS — C50911 Malignant neoplasm of unspecified site of right female breast: Secondary | ICD-10-CM | POA: Diagnosis not present

## 2021-04-19 DIAGNOSIS — E782 Mixed hyperlipidemia: Secondary | ICD-10-CM | POA: Diagnosis not present

## 2021-04-19 DIAGNOSIS — Z0000 Encounter for general adult medical examination without abnormal findings: Secondary | ICD-10-CM | POA: Diagnosis not present

## 2021-04-19 DIAGNOSIS — R232 Flushing: Secondary | ICD-10-CM | POA: Diagnosis not present

## 2021-04-22 ENCOUNTER — Other Ambulatory Visit: Payer: Self-pay | Admitting: Family Medicine

## 2021-04-22 ENCOUNTER — Other Ambulatory Visit: Payer: Self-pay | Admitting: Hematology

## 2021-04-22 DIAGNOSIS — C50911 Malignant neoplasm of unspecified site of right female breast: Secondary | ICD-10-CM

## 2021-04-22 DIAGNOSIS — Z9889 Other specified postprocedural states: Secondary | ICD-10-CM

## 2021-05-11 ENCOUNTER — Other Ambulatory Visit: Payer: BC Managed Care – PPO

## 2021-07-07 DIAGNOSIS — M25511 Pain in right shoulder: Secondary | ICD-10-CM | POA: Diagnosis not present

## 2021-07-08 DIAGNOSIS — M25511 Pain in right shoulder: Secondary | ICD-10-CM | POA: Diagnosis not present

## 2021-07-13 ENCOUNTER — Other Ambulatory Visit: Payer: Self-pay

## 2021-07-13 ENCOUNTER — Ambulatory Visit
Admission: RE | Admit: 2021-07-13 | Discharge: 2021-07-13 | Disposition: A | Discharge status: Home / Self Care | Payer: BC Managed Care – PPO | Source: Ambulatory Visit | Attending: Hematology | Admitting: Hematology

## 2021-07-13 DIAGNOSIS — Z9889 Other specified postprocedural states: Secondary | ICD-10-CM

## 2021-07-26 DIAGNOSIS — E782 Mixed hyperlipidemia: Secondary | ICD-10-CM | POA: Diagnosis not present

## 2021-11-30 DIAGNOSIS — L820 Inflamed seborrheic keratosis: Secondary | ICD-10-CM | POA: Diagnosis not present

## 2021-12-21 DIAGNOSIS — E782 Mixed hyperlipidemia: Secondary | ICD-10-CM | POA: Diagnosis not present

## 2022-03-14 DIAGNOSIS — D225 Melanocytic nevi of trunk: Secondary | ICD-10-CM | POA: Diagnosis not present

## 2022-03-14 DIAGNOSIS — L820 Inflamed seborrheic keratosis: Secondary | ICD-10-CM | POA: Diagnosis not present

## 2022-04-24 DIAGNOSIS — Z853 Personal history of malignant neoplasm of breast: Secondary | ICD-10-CM | POA: Diagnosis not present

## 2022-04-24 DIAGNOSIS — Z0000 Encounter for general adult medical examination without abnormal findings: Secondary | ICD-10-CM | POA: Diagnosis not present

## 2022-04-24 DIAGNOSIS — B351 Tinea unguium: Secondary | ICD-10-CM | POA: Diagnosis not present

## 2022-04-24 DIAGNOSIS — E782 Mixed hyperlipidemia: Secondary | ICD-10-CM | POA: Diagnosis not present

## 2022-06-07 DIAGNOSIS — Z1231 Encounter for screening mammogram for malignant neoplasm of breast: Secondary | ICD-10-CM | POA: Diagnosis not present

## 2022-07-03 DIAGNOSIS — L603 Nail dystrophy: Secondary | ICD-10-CM | POA: Diagnosis not present

## 2022-07-03 DIAGNOSIS — L820 Inflamed seborrheic keratosis: Secondary | ICD-10-CM | POA: Diagnosis not present

## 2022-07-03 DIAGNOSIS — D1721 Benign lipomatous neoplasm of skin and subcutaneous tissue of right arm: Secondary | ICD-10-CM | POA: Diagnosis not present

## 2023-04-30 DIAGNOSIS — E782 Mixed hyperlipidemia: Secondary | ICD-10-CM | POA: Diagnosis not present

## 2023-05-09 DIAGNOSIS — R232 Flushing: Secondary | ICD-10-CM | POA: Diagnosis not present

## 2023-05-09 DIAGNOSIS — Z0000 Encounter for general adult medical examination without abnormal findings: Secondary | ICD-10-CM | POA: Diagnosis not present

## 2023-05-09 DIAGNOSIS — B351 Tinea unguium: Secondary | ICD-10-CM | POA: Diagnosis not present

## 2023-05-09 DIAGNOSIS — E782 Mixed hyperlipidemia: Secondary | ICD-10-CM | POA: Diagnosis not present

## 2023-05-09 DIAGNOSIS — Z23 Encounter for immunization: Secondary | ICD-10-CM | POA: Diagnosis not present

## 2023-06-20 DIAGNOSIS — R232 Flushing: Secondary | ICD-10-CM | POA: Diagnosis not present

## 2023-06-20 DIAGNOSIS — Z79899 Other long term (current) drug therapy: Secondary | ICD-10-CM | POA: Diagnosis not present

## 2023-06-21 DIAGNOSIS — Z1231 Encounter for screening mammogram for malignant neoplasm of breast: Secondary | ICD-10-CM | POA: Diagnosis not present

## 2023-06-21 DIAGNOSIS — R92323 Mammographic fibroglandular density, bilateral breasts: Secondary | ICD-10-CM | POA: Diagnosis not present

## 2023-07-20 DIAGNOSIS — Z79899 Other long term (current) drug therapy: Secondary | ICD-10-CM | POA: Diagnosis not present

## 2023-08-23 DIAGNOSIS — Z79899 Other long term (current) drug therapy: Secondary | ICD-10-CM | POA: Diagnosis not present

## 2023-11-19 DIAGNOSIS — Z79899 Other long term (current) drug therapy: Secondary | ICD-10-CM | POA: Diagnosis not present

## 2024-02-19 DIAGNOSIS — Z79899 Other long term (current) drug therapy: Secondary | ICD-10-CM | POA: Diagnosis not present

## 2024-05-29 DIAGNOSIS — Z853 Personal history of malignant neoplasm of breast: Secondary | ICD-10-CM | POA: Diagnosis not present

## 2024-05-29 DIAGNOSIS — Z0000 Encounter for general adult medical examination without abnormal findings: Secondary | ICD-10-CM | POA: Diagnosis not present

## 2024-05-29 DIAGNOSIS — Z1331 Encounter for screening for depression: Secondary | ICD-10-CM | POA: Diagnosis not present

## 2024-05-29 DIAGNOSIS — E782 Mixed hyperlipidemia: Secondary | ICD-10-CM | POA: Diagnosis not present

## 2024-05-29 DIAGNOSIS — D485 Neoplasm of uncertain behavior of skin: Secondary | ICD-10-CM | POA: Diagnosis not present

## 2024-05-29 DIAGNOSIS — N951 Menopausal and female climacteric states: Secondary | ICD-10-CM | POA: Diagnosis not present

## 2024-05-29 DIAGNOSIS — Z79899 Other long term (current) drug therapy: Secondary | ICD-10-CM | POA: Diagnosis not present

## 2024-05-29 DIAGNOSIS — Z23 Encounter for immunization: Secondary | ICD-10-CM | POA: Diagnosis not present

## 2024-06-12 DIAGNOSIS — L814 Other melanin hyperpigmentation: Secondary | ICD-10-CM | POA: Diagnosis not present

## 2024-06-12 DIAGNOSIS — D485 Neoplasm of uncertain behavior of skin: Secondary | ICD-10-CM | POA: Diagnosis not present

## 2024-06-12 DIAGNOSIS — D235 Other benign neoplasm of skin of trunk: Secondary | ICD-10-CM | POA: Diagnosis not present

## 2024-06-12 DIAGNOSIS — L538 Other specified erythematous conditions: Secondary | ICD-10-CM | POA: Diagnosis not present

## 2024-06-12 DIAGNOSIS — L821 Other seborrheic keratosis: Secondary | ICD-10-CM | POA: Diagnosis not present

## 2024-06-12 DIAGNOSIS — D225 Melanocytic nevi of trunk: Secondary | ICD-10-CM | POA: Diagnosis not present

## 2024-06-12 DIAGNOSIS — L578 Other skin changes due to chronic exposure to nonionizing radiation: Secondary | ICD-10-CM | POA: Diagnosis not present

## 2024-06-12 DIAGNOSIS — D1801 Hemangioma of skin and subcutaneous tissue: Secondary | ICD-10-CM | POA: Diagnosis not present

## 2024-06-12 DIAGNOSIS — L820 Inflamed seborrheic keratosis: Secondary | ICD-10-CM | POA: Diagnosis not present

## 2024-06-13 DIAGNOSIS — Z9882 Breast implant status: Secondary | ICD-10-CM | POA: Diagnosis not present

## 2024-06-13 DIAGNOSIS — R92323 Mammographic fibroglandular density, bilateral breasts: Secondary | ICD-10-CM | POA: Diagnosis not present

## 2024-06-13 DIAGNOSIS — Z1231 Encounter for screening mammogram for malignant neoplasm of breast: Secondary | ICD-10-CM | POA: Diagnosis not present

## 2024-06-25 DIAGNOSIS — R7989 Other specified abnormal findings of blood chemistry: Secondary | ICD-10-CM | POA: Diagnosis not present

## 2024-07-17 DIAGNOSIS — D225 Melanocytic nevi of trunk: Secondary | ICD-10-CM | POA: Diagnosis not present
